# Patient Record
Sex: Female | Born: 1940 | Race: White | Hispanic: No | Marital: Married | Smoking: Never smoker
Health system: Southern US, Community
[De-identification: ages and names within clinical notes are randomized; demographics above are authoritative.]

## PROBLEM LIST (undated history)

## (undated) DIAGNOSIS — M810 Age-related osteoporosis without current pathological fracture: Secondary | ICD-10-CM

## (undated) DIAGNOSIS — E559 Vitamin D deficiency, unspecified: Secondary | ICD-10-CM

## (undated) DIAGNOSIS — Z923 Personal history of irradiation: Secondary | ICD-10-CM

## (undated) DIAGNOSIS — G4700 Insomnia, unspecified: Secondary | ICD-10-CM

## (undated) DIAGNOSIS — C801 Malignant (primary) neoplasm, unspecified: Secondary | ICD-10-CM

## (undated) DIAGNOSIS — E785 Hyperlipidemia, unspecified: Secondary | ICD-10-CM

## (undated) DIAGNOSIS — IMO0001 Reserved for inherently not codable concepts without codable children: Secondary | ICD-10-CM

## (undated) DIAGNOSIS — E079 Disorder of thyroid, unspecified: Secondary | ICD-10-CM

## (undated) DIAGNOSIS — I10 Essential (primary) hypertension: Secondary | ICD-10-CM

## (undated) DIAGNOSIS — K219 Gastro-esophageal reflux disease without esophagitis: Secondary | ICD-10-CM

## (undated) HISTORY — PX: HIP ARTHROPLASTY: SHX981

## (undated) HISTORY — DX: Disorder of thyroid, unspecified: E07.9

## (undated) HISTORY — DX: Malignant (primary) neoplasm, unspecified: C80.1

## (undated) HISTORY — DX: Hyperlipidemia, unspecified: E78.5

## (undated) HISTORY — PX: THYROIDECTOMY: SHX17

## (undated) HISTORY — PX: BREAST SURGERY: SHX581

## (undated) HISTORY — DX: Gastro-esophageal reflux disease without esophagitis: K21.9

## (undated) HISTORY — DX: Insomnia, unspecified: G47.00

## (undated) HISTORY — DX: Reserved for inherently not codable concepts without codable children: IMO0001

## (undated) HISTORY — PX: OTHER SURGICAL HISTORY: SHX169

## (undated) HISTORY — DX: Age-related osteoporosis without current pathological fracture: M81.0

## (undated) HISTORY — DX: Vitamin D deficiency, unspecified: E55.9

## (undated) HISTORY — PX: BUNIONECTOMY: SHX129

## (undated) HISTORY — DX: Essential (primary) hypertension: I10

## (undated) HISTORY — PX: ABDOMINAL HYSTERECTOMY: SHX81

## (undated) HISTORY — PX: COLONOSCOPY: SHX174

## (undated) HISTORY — PX: TONSILLECTOMY: SUR1361

---

## 2005-10-03 ENCOUNTER — Ambulatory Visit: Payer: Self-pay | Admitting: Family Medicine

## 2005-10-21 ENCOUNTER — Ambulatory Visit: Payer: Self-pay | Admitting: Family Medicine

## 2006-07-23 ENCOUNTER — Emergency Department: Payer: Self-pay | Admitting: Emergency Medicine

## 2006-07-23 ENCOUNTER — Other Ambulatory Visit: Payer: Self-pay

## 2006-08-09 ENCOUNTER — Ambulatory Visit: Payer: Self-pay | Admitting: Family Medicine

## 2006-12-18 ENCOUNTER — Ambulatory Visit: Payer: Self-pay | Admitting: Unknown Physician Specialty

## 2007-08-13 ENCOUNTER — Ambulatory Visit: Payer: Self-pay | Admitting: Family Medicine

## 2008-07-02 ENCOUNTER — Ambulatory Visit: Payer: Self-pay | Admitting: Unknown Physician Specialty

## 2008-09-08 ENCOUNTER — Ambulatory Visit: Payer: Self-pay | Admitting: Family Medicine

## 2008-10-16 ENCOUNTER — Emergency Department: Payer: Self-pay | Admitting: Emergency Medicine

## 2009-09-10 ENCOUNTER — Ambulatory Visit: Payer: Self-pay | Admitting: Family Medicine

## 2010-09-13 ENCOUNTER — Ambulatory Visit: Payer: Self-pay | Admitting: Family Medicine

## 2011-09-13 ENCOUNTER — Ambulatory Visit: Payer: Self-pay | Admitting: Family Medicine

## 2011-09-15 ENCOUNTER — Ambulatory Visit: Payer: Self-pay | Admitting: Family Medicine

## 2012-09-18 ENCOUNTER — Ambulatory Visit: Payer: Self-pay | Admitting: Family Medicine

## 2012-10-16 DIAGNOSIS — H33319 Horseshoe tear of retina without detachment, unspecified eye: Secondary | ICD-10-CM | POA: Insufficient documentation

## 2012-10-24 DIAGNOSIS — E559 Vitamin D deficiency, unspecified: Secondary | ICD-10-CM | POA: Insufficient documentation

## 2013-10-09 ENCOUNTER — Ambulatory Visit: Payer: Self-pay | Admitting: Family Medicine

## 2013-12-26 ENCOUNTER — Ambulatory Visit: Payer: Self-pay | Admitting: Unknown Physician Specialty

## 2013-12-31 LAB — PATHOLOGY REPORT

## 2014-04-24 ENCOUNTER — Ambulatory Visit: Payer: Self-pay | Admitting: Family Medicine

## 2014-05-01 DIAGNOSIS — K5900 Constipation, unspecified: Secondary | ICD-10-CM | POA: Insufficient documentation

## 2014-05-01 DIAGNOSIS — R197 Diarrhea, unspecified: Secondary | ICD-10-CM | POA: Insufficient documentation

## 2014-05-01 DIAGNOSIS — T185XXA Foreign body in anus and rectum, initial encounter: Secondary | ICD-10-CM | POA: Insufficient documentation

## 2014-05-01 DIAGNOSIS — R109 Unspecified abdominal pain: Secondary | ICD-10-CM | POA: Insufficient documentation

## 2014-12-30 ENCOUNTER — Ambulatory Visit: Payer: Self-pay | Admitting: Family Medicine

## 2015-06-24 ENCOUNTER — Encounter: Payer: Self-pay | Admitting: Psychiatry

## 2015-06-24 ENCOUNTER — Ambulatory Visit (INDEPENDENT_AMBULATORY_CARE_PROVIDER_SITE_OTHER): Payer: Medicare PPO | Admitting: Psychiatry

## 2015-06-24 VITALS — BP 142/88 | HR 78 | Temp 97.3°F | Ht 62.5 in | Wt 147.4 lb

## 2015-06-24 DIAGNOSIS — IMO0001 Reserved for inherently not codable concepts without codable children: Secondary | ICD-10-CM | POA: Insufficient documentation

## 2015-06-24 DIAGNOSIS — F4322 Adjustment disorder with anxiety: Secondary | ICD-10-CM

## 2015-06-24 DIAGNOSIS — C73 Malignant neoplasm of thyroid gland: Secondary | ICD-10-CM | POA: Insufficient documentation

## 2015-06-24 DIAGNOSIS — E789 Disorder of lipoprotein metabolism, unspecified: Secondary | ICD-10-CM | POA: Insufficient documentation

## 2015-06-24 DIAGNOSIS — G4700 Insomnia, unspecified: Secondary | ICD-10-CM | POA: Insufficient documentation

## 2015-06-24 DIAGNOSIS — I10 Essential (primary) hypertension: Secondary | ICD-10-CM | POA: Insufficient documentation

## 2015-06-24 DIAGNOSIS — M810 Age-related osteoporosis without current pathological fracture: Secondary | ICD-10-CM | POA: Insufficient documentation

## 2015-06-24 DIAGNOSIS — M1990 Unspecified osteoarthritis, unspecified site: Secondary | ICD-10-CM | POA: Insufficient documentation

## 2015-06-24 DIAGNOSIS — K219 Gastro-esophageal reflux disease without esophagitis: Secondary | ICD-10-CM

## 2015-06-24 NOTE — Progress Notes (Signed)
Psychiatric Initial Adult Assessment   Patient Identification: Alisha Stevens MRN:  [id number] Date of Evaluation:  06/24/2015 Referral Source: Dr. Norman Clay Chief Complaint:  "I hope a third party will be able to's shed some light" on dealing with our son. Chief Complaint    Establish Care; Stress     Visit Diagnosis: No diagnosis found. Diagnosis:   Patient Active Problem List   Diagnosis Date Noted  . Borderline high serum cholesterol [E78.9] 06/24/2015  . BP (high blood pressure) [I10] 06/24/2015  . Cannot sleep [G47.00] 06/24/2015  . 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 Reflux [K21.9] 06/24/2015  . OP (osteoporosis) [M81.0] 06/24/2015  . Arthritis, degenerative [M19.90] 06/24/2015  . Abdominal pain [R10.9] 05/01/2014  . CN (constipation) [K59.00] 05/01/2014  . D (diarrhea) [R19.7] 05/01/2014  . Foreign body of rectum [T18.5XXA] 05/01/2014  . Avitaminosis D [E55.9] 10/24/2012  . 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 She states she has anxiety and worry about her adult son. She relates that her son has an issue with alcohol. She related that 7-8 years ago he was arrested for a DUI. She related that at the time his children were in the car. She states eventually lost his family and has experienced multiple job losses. However they state he intermittently continues to abuse alcohol.  Patient denied symptoms of a major depressive episode but states that she does have periods where she becomes socially withdrawn and might not attend her exercise classes or be around other people. She states that her anxiety is more constant and its worry some and daily. She states that all centers around her son. She states that there are periods where he can do well when he is not drinking and they can have good social interactions with him. However she stated that he then can have  exacerbation of his drinking, become verbally abusive to his second wife. She relates he is also having problems with child support and garnishing of his wages. She relates that in the past they have given him money and been supportive. She relates they worry about their grandsons. Patient states she continues to worry and be frustrated by his alcohol use, refusal to engage in any kind of treatment or therapy for his issues.  Patient and husband did not appear to be looking for medications at this point but rather interested in coping mechanisms and therapy related to the anxiety worry and frustration. Elements:  Duration:  As noted above.. Associated Signs/Symptoms: Depression Symptoms:  As noted above patient denied any symptoms of major depressive episode but states that she can have periods where she socially withdrawn and does not attend things like exercise classes. (Hypo) Manic Symptoms:  None Anxiety Symptoms:  Worry about son and grandchildren as discussed above Psychotic Symptoms:  None PTSD Symptoms: Negative  Past Medical History:  Past Medical History  Diagnosis Date  . Insomnia   . Osteoporosis   . Hyperlipemia   . Hypertension   . Reflux   . Vitamin D deficiency   . Cancer   . Thyroid disease     Past Surgical History  Procedure Laterality Date  . Thyroidectomy    . Bunionectomy Right   . Breast surgery    . Abdominal hysterectomy    . Tonsillectomy    . Colonoscopy    . Retinopexy Right    Family History:  Family History  Problem Relation Age  of Onset  . Congestive Heart Failure Mother   . Hypertension Mother   . Stroke Mother   . Colon polyps Father   . Heart attack Father   . Multiple sclerosis Sister   . Alcohol abuse Paternal Grandmother   . Hypertension Sister    Social History:   History   Social History  . Marital Status: Married    Spouse Name: N/A  . Number of Children: N/A  . Years of Education: N/A   Social History Main Topics  .  Smoking status: Never Smoker   . Smokeless tobacco: Never Used  . Alcohol Use: 1.8 oz/week    0 Standard drinks or equivalent, 3 Glasses of wine per week  . Drug Use: No  . Sexual Activity: Yes    Birth Control/ Protection: None   Other Topics Concern  . None   Social History Narrative  . None   Additional Social History: Patient states she grew up in a small town in [place]. She states that she met her husband in [place]. She states they've spent time in [place] and then now have been in [place] for 40 years. 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 They have one daughter age 34 and 34 son age 59.  Musculoskeletal: Strength & Muscle Tone: within normal limits Gait & Station: normal Patient leans: N/A  Psychiatric Specialty Exam: HPI  Review of Systems  Psychiatric/Behavioral: Negative for depression, suicidal ideas, hallucinations, memory loss and substance abuse. The patient is nervous/anxious (Related to son and the consequences of son's alcohol use). The patient does not have insomnia.     Blood pressure 142/88, pulse 78, temperature 97.3 F (36.3 C), temperature source Tympanic, height 5' 2.5" (1.588 m), weight 147 lb 6.4 oz (66.86 kg), SpO2 95 %.Body mass index is 26.51 kg/(m^2).  General Appearance: Neat and Well Groomed  Eye Contact:  Good  Speech:  Normal Rate  Volume:  Normal  Mood:  Good  Affect:  Congruent  Thought Process:  Linear and Logical  Orientation:  Full (Time, Place, and Person)  Thought Content:  Negative  Suicidal Thoughts:  No  Homicidal Thoughts:  No  Memory:  Immediate;   Good Recent;   Good Remote;   Good  Judgement:  Good  Insight:  Good  Psychomotor Activity:  Negative  Concentration:  Good  Recall:  Good  Fund of Knowledge:Good  Language: Good  Akathisia:  Negative  Handed:  Right unknown   AIMS (if indicated):  N/A  Assets:  Communication Skills Desire for Improvement Social Support   ADL's:  Intact  Cognition: WNL  Sleep:  okay   Is the patient at risk to self?  No. Has the patient been a risk to self in the past 6 months?  No. Has the patient been a risk to self within the distant past?  No. Is the patient a risk to others?  No. Has the patient been a risk to others in the past 6 months?  No. Has the patient been a risk to others within the distant past?  No.  Allergies:   Allergies  Allergen Reactions  . Hydralazine Other (See Comments)    Confusion, chest pain   . Neomycin Diarrhea   Current Medications: Current Outpatient Prescriptions  Medication Sig Dispense Refill  . amLODipine (NORVASC) 2.5 MG tablet     . aspirin 81 MG tablet Take by mouth.    . Cholecalciferol (VITAMIN  D-1000 MAX ST) 1000 UNITS tablet Take by mouth.    . levothyroxine (SYNTHROID, LEVOTHROID) 125 MCG tablet     . omeprazole (PRILOSEC) 40 MG capsule      No current facility-administered medications for this visit.    Previous Psychotropic Medications: No   Substance Abuse History in the last 12 months:  No.  Consequences of Substance Abuse: NA  Medical Decision Making:  Review of Psycho-Social Stressors (1) and Established Problem, Worsening (2)  Treatment Plan Summary: Plan Patient is presenting for therapy and coping mechanisms related to a alcoholic son. She does not desire medication treatment at this time. Have discussed the case with therapist in the clinic and referred patient for therapy.  In regards to risk assessment the patient has risk factors of age and race. Patient has protective factors of no affective illness, no past suicide attempts, good social support and no current substance use disorder. At this time low risk of imminent harm to herself or others.  Patient will be referred to therapist. Should medication management be desired she has been informed that she can come back to this writer or be referred by therapist.    Faith Rogue 8/3/201611:50  AM

## 2015-07-03 ENCOUNTER — Ambulatory Visit (INDEPENDENT_AMBULATORY_CARE_PROVIDER_SITE_OTHER): Payer: Medicare PPO | Admitting: Licensed Clinical Social Worker

## 2015-07-03 DIAGNOSIS — F4323 Adjustment disorder with mixed anxiety and depressed mood: Secondary | ICD-10-CM

## 2015-07-03 NOTE — Progress Notes (Signed)
Patient ID: Alisha Stevens, female   DOB: 10-17-1941, 74 y.o.   MRN: [id number] Patient:   Alisha Stevens   DOB:   [birth year]  MR Number:  [id number]  Location:  [facility] [address] [place] [address] Dept: [phone]           Date of Service:   07/03/2015  Start Time:   1p End Time:   2p  Provider/Observer:  Lubertha South Counselor       Billing Code/Service: [phone]  Behavioral Observation: Alisha Stevens  presents as a 74 y.o.-year-old Caucasian Female who appeared her stated age. her Stevens was Appropriate and she was Well Groomed and her manners were Appropriate to the situation.  There were not any physical disabilities noted.  she displayed an appropriate level of cooperation and motivation.    Interactions:    Active   Attention:   within normal limits  Memory:   within normal limits  Speech (Volume):  normal  Speech:   normal volume  Thought Process:  Coherent  Though Content:  WNL  Orientation:   person, place, time/date and situation  Judgment:   Fair  Planning:   Fair  Affect:    Depressed  Mood:    Depressed  Insight:   Good  Intelligence:   normal  Chief Complaint:     Chief Complaint  Patient presents with  . Family Problem  . Stress  . Establish Care    Reason for Service:  To learn coping skills to deal with her son  Current Symptoms:  Stress,   Source of Distress:              5 year old son  Marital Status/Living: Married/currently living with her husband  Employment History: retired  Education:   Careers adviser History:  Denies   Careers adviser:  Denies    Religious/Spiritual Preferences:  Attends weekly  Family/Childhood History:                           Reports that she has a good childhood.  Reports that she raised her children right.    Children/Grand-children:    2 children; three Grandchildren  Natural/Informal Support:                           Husband, daughter, friends   Substance Use:  No concerns of substance abuse are reported.   Medical History:   Past Medical History  Diagnosis Date  . Insomnia   . Osteoporosis   . Hyperlipemia   . Hypertension   . Reflux   . Vitamin D deficiency   . Cancer   . Thyroid disease           Medication List       This list is accurate as of: 07/03/15  1:15 PM.  Always use your most recent med list.               amLODipine 2.5 MG tablet  Commonly known as:  NORVASC     aspirin 81 MG tablet  Take by mouth.     levothyroxine 125 MCG tablet  Commonly known as:  SYNTHROID, LEVOTHROID     omeprazole 40 MG capsule  Commonly known as:  PRILOSEC     VITAMIN D-1000 MAX ST 1000 UNITS tablet  Generic drug:  Cholecalciferol  Take by mouth.              Sexual History:   History  Sexual Activity  . Sexual Activity: Yes  . Birth Web designer: None     Abuse/Trauma History: Denies    Psychiatric History:  Denies   Strengths:   Building services engineer, caring   Recovery Goals:  "To get help for my son."  Hobbies/Interests:               Shopping, traveling   Challenges/Barriers: "My son not wanting help."    Family Med/Psych History:  Family History  Problem Relation Age of Onset  . Congestive Heart Failure Mother   . Hypertension Mother   . Stroke Mother   . Colon polyps Father   . Heart attack Father   . Multiple sclerosis Sister   . Alcohol abuse Paternal Grandmother   . 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  She is worried that her 40 year old son will continue to use alcohol to cope with life stressors.   She continues to get stressed thinking about the consequences that have taken place in his life and he continues to use alcohol.  She recently began worrying about her daughter whom is getting separated from her husband.  Patient is diagnosed with Adjustment Disorder due to the recent changes in her life and her inability to cope.  She endorses lack of sleep, anxiety, lack of motivation.  Recommendation/Plan: Writer recommends Outpatient Therapy at least twice monthly to include but not limited to individual, group and or family therapy.

## 2015-07-06 ENCOUNTER — Ambulatory Visit: Payer: Medicare PPO | Admitting: Licensed Clinical Social Worker

## 2015-07-06 ENCOUNTER — Ambulatory Visit: Payer: Self-pay | Admitting: Licensed Clinical Social Worker

## 2016-07-04 ENCOUNTER — Ambulatory Visit (INDEPENDENT_AMBULATORY_CARE_PROVIDER_SITE_OTHER): Payer: Medicare PPO | Admitting: Licensed Clinical Social Worker

## 2016-07-04 DIAGNOSIS — F4323 Adjustment disorder with mixed anxiety and depressed mood: Secondary | ICD-10-CM | POA: Diagnosis not present

## 2016-07-04 NOTE — Progress Notes (Signed)
Comprehensive Clinical Assessment (CCA) Note  07/04/2016 Alisha Stevens [id number]  Visit Diagnosis:   No diagnosis found.    CCA Part One  Part One has been completed on paper by the patient.  (See scanned document in Chart Review)  CCA Part Two A  Intake/Chief Complaint:  CCA Intake With Chief Complaint CCA Part Two Date: 07/04/16 CCA Part Two Time: 106 Chief Complaint/Presenting Problem: my family is falling apart Patients Currently Reported Symptoms/Problems: my son wife kicked him out.  He is currently at my house.  I want him to go to a homeless shelter.  This is so hard.  We let him come back to the house. I am struggling with determining if my husband and I are doing the right thing.  My son has difficulty with alcohol and adultery with their neighbor.Marland Kitchen He continues to dig himself deeeper in the rabbit hole. He refuses to seek help.  I am having emotional breakdown (crying) Individual's Strengths: "I can express myself" Individual's Preferences: My son to get it together Individual's Abilities: to handle anything Type of Services Patient Feels Are Needed: therapy  Mental Health Symptoms Depression:  Depression: Tearfulness, Sleep (too much or little), Change in energy/activity, Difficulty Concentrating  Mania:  Mania: N/A  Anxiety:   Anxiety: Worrying, Tension, Difficulty concentrating  Psychosis:  Psychosis: N/A  Trauma:  Trauma: N/A  Obsessions:  Obsessions: N/A  Compulsions:  Compulsions: N/A  Inattention:  Inattention: N/A  Hyperactivity/Impulsivity:  Hyperactivity/Impulsivity: N/A  Oppositional/Defiant Behaviors:  Oppositional/Defiant Behaviors: N/A  Borderline Personality:  Emotional Irregularity: N/A  Other Mood/Personality Symptoms:      Mental Status Exam Appearance and self-care  Stature:  Stature: Average  Weight:  Weight: Average weight  Clothing:  Clothing: Neat/clean  Grooming:  Grooming: Normal  Cosmetic use:  Cosmetic Use: Age  appropriate  Posture/gait:  Posture/Gait: Normal  Motor activity:  Motor Activity: Not Remarkable  Sensorium  Attention:  Attention: Normal  Concentration:  Concentration: Normal  Orientation:  Orientation: X5  Recall/memory:  Recall/Memory: Normal  Affect and Mood  Affect:  Affect: Appropriate  Mood:     Relating  Eye contact:  Eye Contact: Normal  Facial expression:  Facial Expression: Responsive  Attitude toward examiner:  Attitude Toward Examiner: Cooperative  Thought and Language  Speech flow: Speech Flow: Normal  Thought content:  Thought Content: Appropriate to mood and circumstances  Preoccupation:     Hallucinations:     Organization:     Transport planner of Knowledge:  Fund of Knowledge: Average  Intelligence:  Intelligence: Average  Abstraction:  Abstraction: Normal  Judgement:  Judgement: Normal  Reality Testing:  Reality Testing: Adequate  Insight:  Insight: Good  Decision Making:  Decision Making: Normal  Social Functioning  Social Maturity:  Social Maturity: Responsible  Social Judgement:  Social Judgement: Normal  Stress  Stressors:  Stressors: Family conflict  Coping Ability:  Coping Ability: English as a second language teacher Deficits:     Supports:      Family and Psychosocial History: Family history Marital status: Married Number of Years Married: 40 What types of issues is patient dealing with in the relationship?: "family issues with the kids." Are you sexually active?: No What is your sexual orientation?: heterosexual Does patient have children?: Yes How many children?: 2 Alisha Stevens 49, Alisha Stevens) How is patient's relationship with their children?: Good relationship.  Alisha Stevens recently moved into the home.       Alisha Stevens is gong through a divorce.  Childhood History:  Childhood History By whom was/is the patient raised?: Both parents Additional childhood history information: Born in [place], grew up in [place]  Description of patient's  relationship with caregiver when they were a child: Mother: great, happy Father: great happy childhood Patient's description of current relationship with people who raised him/her: Deceased How were you disciplined when you got in trouble as a child/adolescent?: Father: he didn't do that much spanking.  Mother: she used a "switch" Does patient have siblings?: Yes Number of Siblings: 3 (82, 97, 80) Description of patient's current relationship with siblings: great.  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 Has patient ever been in the [employer]?: No  Education: Education Name of [place]: [employer] Did [employer] Graduate From [employer]?: Yes Did [place]?: Yes Building surveyor) What Type of College Degree Do you Have?: AssociatesBusiness  Religion: Religion/Spirituality Are You A Religious Person?: No  Leisure/Recreation: Leisure / Recreation Leisure and Hobbies: play bridge, entertain, walk, play kanasta, in the [employer], lunch groups  Exercise/Diet: Exercise/Diet Do You Exercise?: Yes What Type of Exercise Do You Do?: Run/Walk How Many Times a Week Do You Exercise?: Daily Have You Gained or Lost A Significant Amount of Weight in the Past Six Months?: No Do You Follow a Special Diet?: No Do You Have Any Trouble Sleeping?: Yes Explanation of Sleeping Difficulties: occassionally. I  have difficulty sleeping  CCA Part Two C  Alcohol/Drug Use: Alcohol / Drug Use Pain Medications: no Prescriptions: Blood Pressure, Synthroid, Prilosec Over the Counter: Vitman D & Aspirin, Calcium History of alcohol / drug use?: No history of alcohol / drug abuse                      CCA Part Three  ASAM's:  Six Dimensions of Multidimensional Assessment  Dimension 1:  Acute Intoxication and/or Withdrawal Potential:     Dimension 2:  Biomedical Conditions and Complications:     Dimension 3:  Emotional, Behavioral, or Cognitive Conditions and Complications:     Dimension 4:  Readiness to Change:     Dimension 5:  Relapse, Continued use, or Continued Problem Potential:     Dimension 6:  Recovery/Living Environment:      Substance use Disorder (SUD)    Social Function:  Social Functioning Social Maturity: Responsible Social Judgement: Normal  Stress:  Stress Stressors: Family conflict Coping Ability: Overwhelmed Patient Takes Medications The Way The Doctor Instructed?: Yes Priority Risk: Low Acuity  Risk Assessment- Self-Harm Potential: Risk Assessment For Self-Harm Potential Thoughts of Self-Harm: No current thoughts Method: No plan Availability of Means: No access/NA Additional Information for Self-Harm Potential: Acts of Self-harm  Risk Assessment -Dangerous to Others Potential: Risk Assessment For Dangerous to Others Potential Method: No Plan Availability of Means: No access or NA Intent: Vague intent or NA Notification Required: No need or identified person  DSM5 Diagnoses: Patient Active Problem List   Diagnosis Date  Noted  . Borderline high serum cholesterol 06/24/2015  . BP (high blood pressure) 06/24/2015  . Cannot sleep 06/24/2015  . Carcinoma of thyroid ([place]) 06/24/2015  . Reflux 06/24/2015  . OP (osteoporosis) 06/24/2015  . Arthritis, degenerative 06/24/2015  . Abdominal pain 05/01/2014  . CN (constipation) 05/01/2014  . D (diarrhea)  05/01/2014  . Foreign body of rectum 05/01/2014  . Avitaminosis D 10/24/2012  . 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

## 2016-07-11 ENCOUNTER — Other Ambulatory Visit: Payer: Self-pay | Admitting: Family Medicine

## 2016-07-11 DIAGNOSIS — Z1231 Encounter for screening mammogram for malignant neoplasm of breast: Secondary | ICD-10-CM

## 2016-07-19 ENCOUNTER — Ambulatory Visit
Admission: RE | Admit: 2016-07-19 | Discharge: 2016-07-19 | Disposition: A | Discharge status: Home / Self Care | Payer: Medicare PPO | Source: Ambulatory Visit | Attending: Family Medicine | Admitting: Family Medicine

## 2016-07-19 DIAGNOSIS — Z1231 Encounter for screening mammogram for malignant neoplasm of breast: Secondary | ICD-10-CM | POA: Insufficient documentation

## 2017-06-13 ENCOUNTER — Other Ambulatory Visit: Payer: Self-pay | Admitting: Family Medicine

## 2017-06-13 DIAGNOSIS — Z1239 Encounter for other screening for malignant neoplasm of breast: Secondary | ICD-10-CM

## 2017-06-23 ENCOUNTER — Other Ambulatory Visit: Payer: Self-pay | Admitting: Family Medicine

## 2017-06-23 DIAGNOSIS — Z1231 Encounter for screening mammogram for malignant neoplasm of breast: Secondary | ICD-10-CM

## 2017-07-31 ENCOUNTER — Ambulatory Visit
Admission: RE | Admit: 2017-07-31 | Discharge: 2017-07-31 | Disposition: A | Discharge status: Home / Self Care | Payer: Medicare HMO | Source: Ambulatory Visit | Attending: Family Medicine | Admitting: Family Medicine

## 2017-07-31 DIAGNOSIS — Z1239 Encounter for other screening for malignant neoplasm of breast: Secondary | ICD-10-CM

## 2017-07-31 DIAGNOSIS — Z1231 Encounter for screening mammogram for malignant neoplasm of breast: Secondary | ICD-10-CM | POA: Insufficient documentation

## 2018-10-08 IMAGING — MG MM DIGITAL SCREENING BILAT W/ CAD
4 series · 4 of 4 positions shown · non-contrast
Comparison: Previous exam(s).

CLINICAL DATA: Screening.

EXAM:
DIGITAL SCREENING BILATERAL MAMMOGRAM WITH CAD

[L CC]
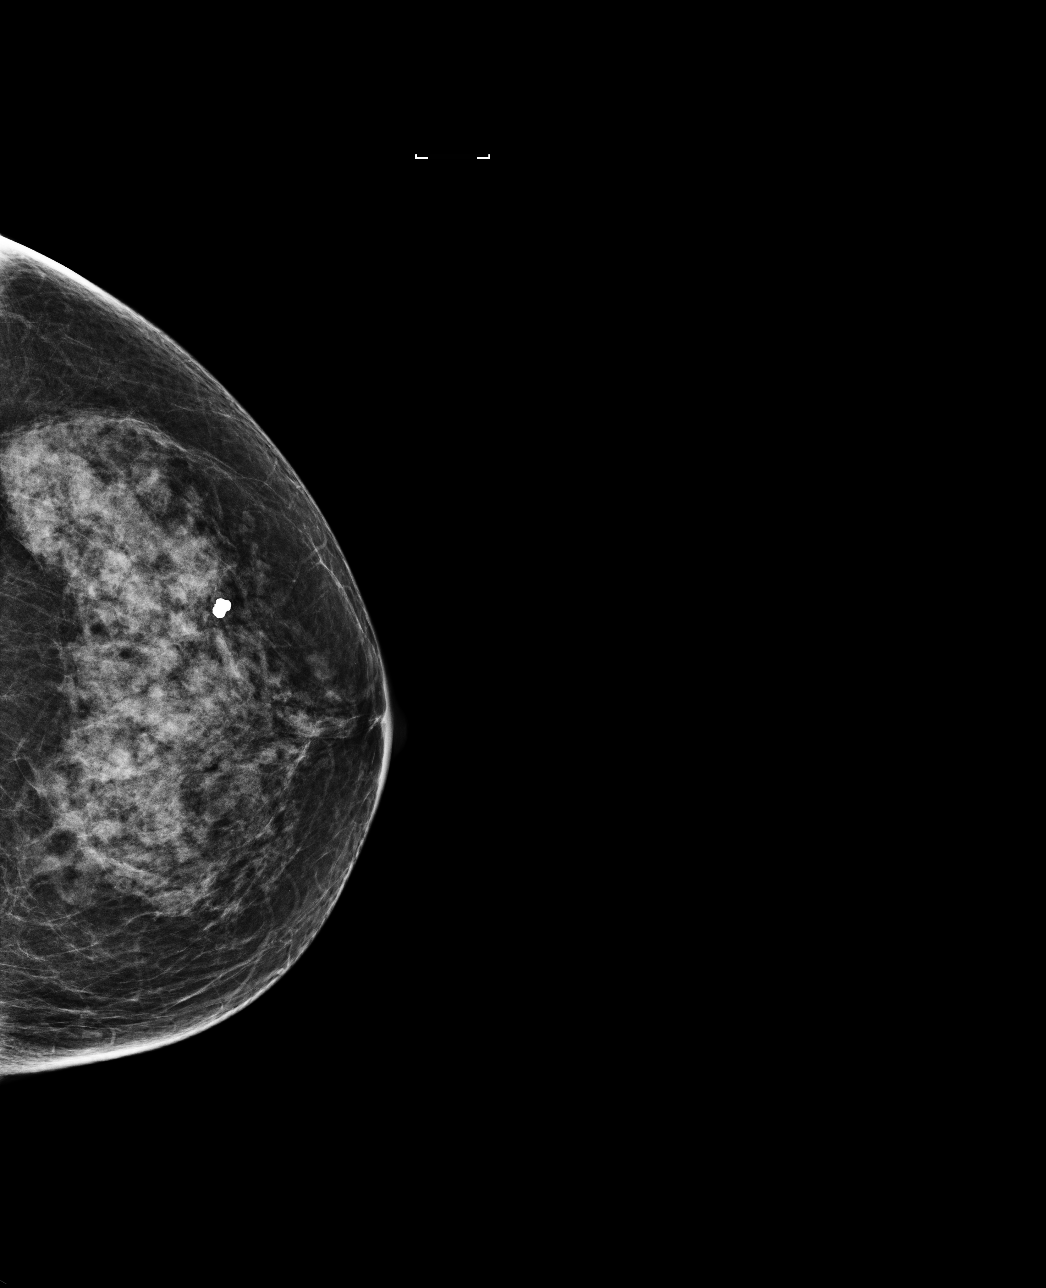

[R MLO]
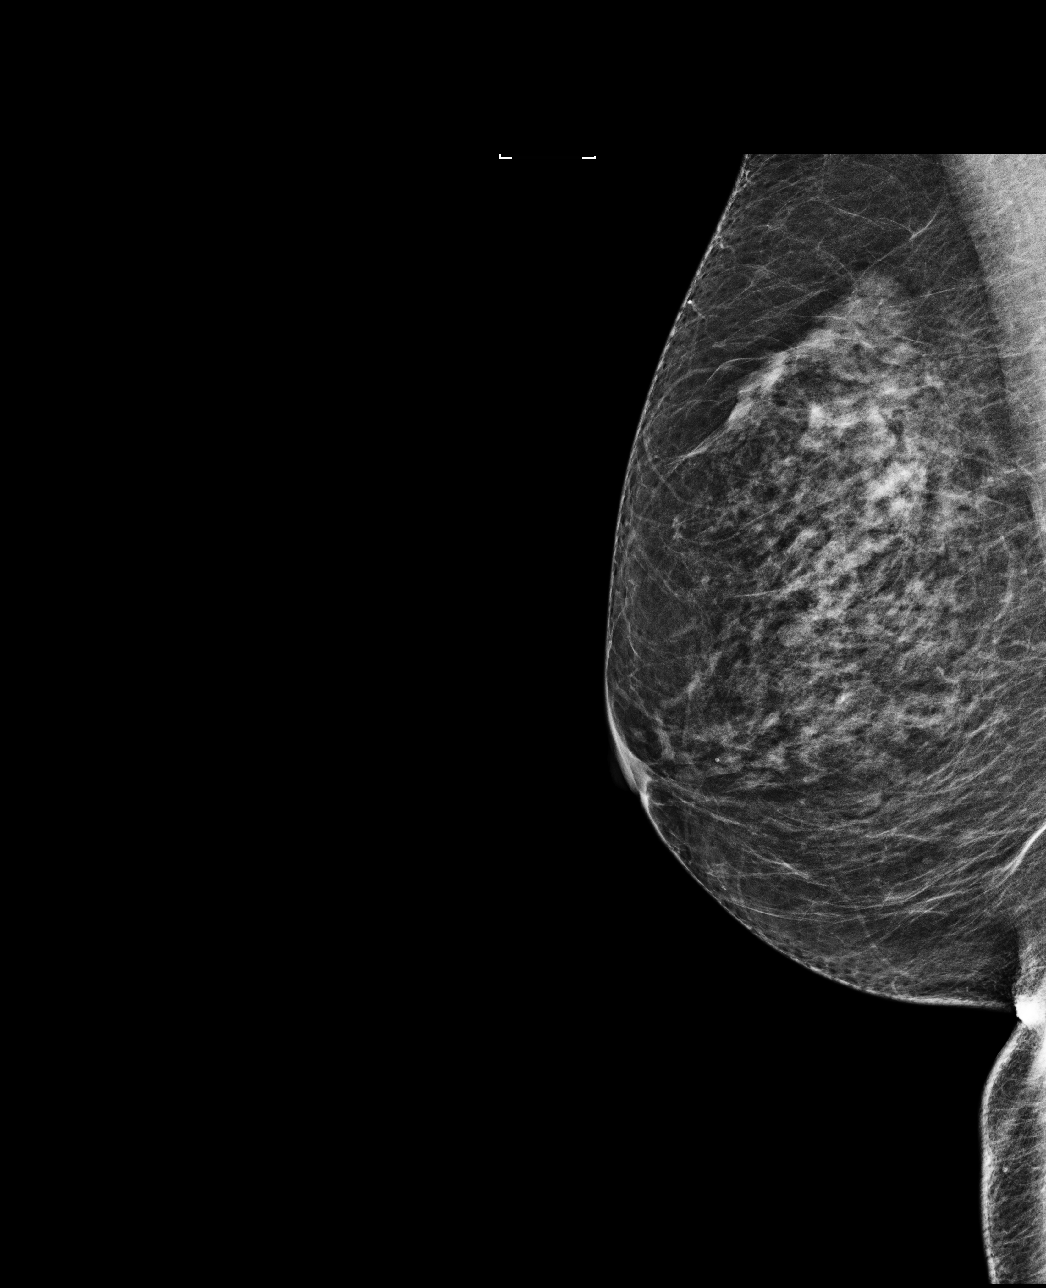

[L MLO]
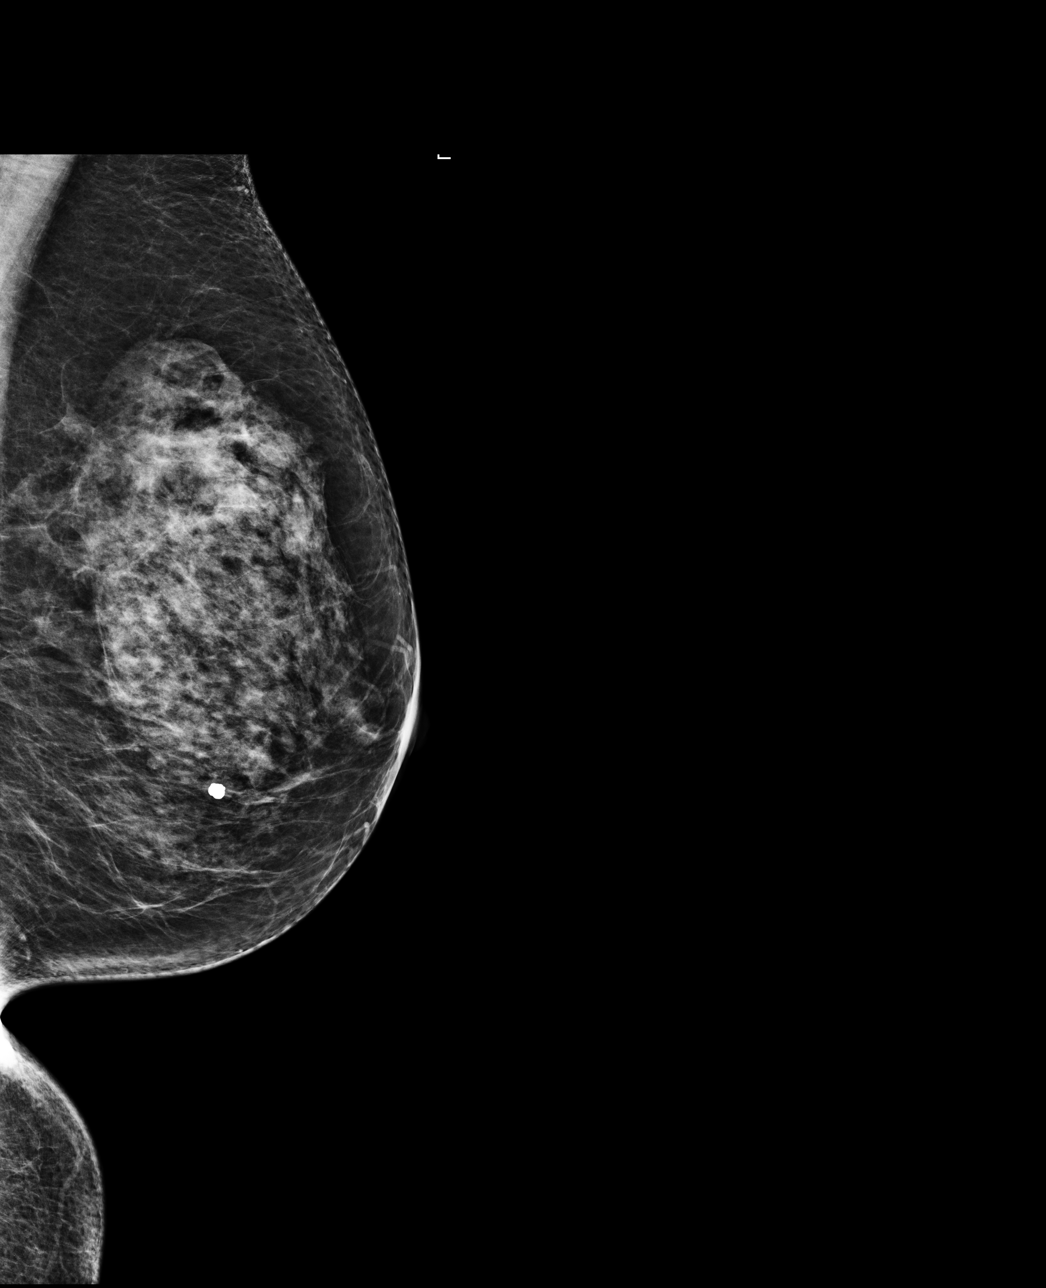

[R CC]
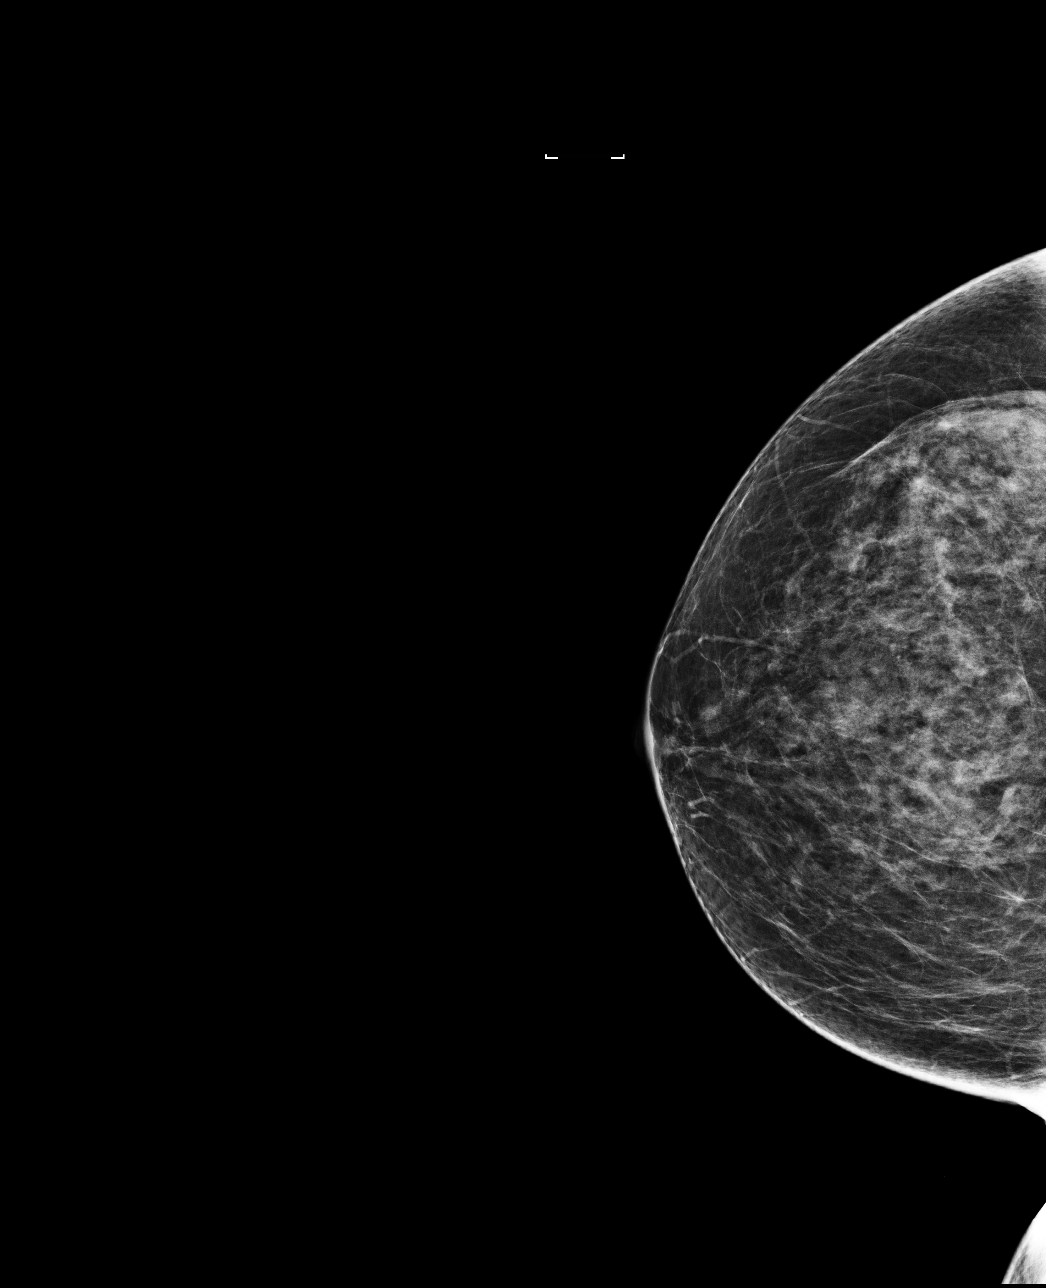

[4 of 4 positions shown; findings below may reference images not displayed]

ACR Breast Density Category c: The breast tissue is heterogeneously
dense, which may obscure small masses.
FINDINGS: There are no findings suspicious for malignancy. Images were
processed with CAD.
IMPRESSION: No mammographic evidence of malignancy. A result letter of this
screening mammogram will be mailed directly to the patient.

RECOMMENDATION:
Screening mammogram in one year. (Code:[id number])

BI-RADS CATEGORY  1: Negative.

## 2018-12-07 ENCOUNTER — Other Ambulatory Visit: Payer: Self-pay | Admitting: Family Medicine

## 2018-12-07 DIAGNOSIS — Z1231 Encounter for screening mammogram for malignant neoplasm of breast: Secondary | ICD-10-CM

## 2018-12-09 DIAGNOSIS — E890 Postprocedural hypothyroidism: Secondary | ICD-10-CM | POA: Insufficient documentation

## 2018-12-20 ENCOUNTER — Encounter (INDEPENDENT_AMBULATORY_CARE_PROVIDER_SITE_OTHER): Payer: Self-pay

## 2018-12-20 ENCOUNTER — Ambulatory Visit
Admission: RE | Admit: 2018-12-20 | Discharge: 2018-12-20 | Disposition: A | Discharge status: Home / Self Care | Payer: Medicare HMO | Source: Ambulatory Visit | Attending: Family Medicine | Admitting: Family Medicine

## 2018-12-20 DIAGNOSIS — Z1231 Encounter for screening mammogram for malignant neoplasm of breast: Secondary | ICD-10-CM | POA: Diagnosis present

## 2018-12-20 HISTORY — DX: Personal history of irradiation: Z92.3

## 2019-03-11 ENCOUNTER — Encounter: Admission: RE | Payer: Self-pay | Source: Home / Self Care

## 2019-03-11 ENCOUNTER — Ambulatory Visit
Admission: RE | Admit: 2019-03-11 | Payer: Medicare HMO | Source: Home / Self Care | Admitting: Unknown Physician Specialty

## 2019-03-11 SURGERY — COLONOSCOPY WITH PROPOFOL
Anesthesia: General

## 2020-01-27 ENCOUNTER — Other Ambulatory Visit: Payer: Self-pay | Admitting: Certified Registered Nurse Anesthetist

## 2020-01-27 ENCOUNTER — Other Ambulatory Visit: Payer: Self-pay | Admitting: Family Medicine

## 2020-01-27 DIAGNOSIS — E2839 Other primary ovarian failure: Secondary | ICD-10-CM

## 2020-01-27 DIAGNOSIS — Z1231 Encounter for screening mammogram for malignant neoplasm of breast: Secondary | ICD-10-CM

## 2020-06-16 ENCOUNTER — Other Ambulatory Visit: Admission: RE | Admit: 2020-06-16 | Payer: Medicare HMO | Source: Ambulatory Visit

## 2020-06-17 ENCOUNTER — Other Ambulatory Visit
Admission: RE | Admit: 2020-06-17 | Discharge: 2020-06-17 | Disposition: A | Discharge status: Home / Self Care | Payer: Medicare HMO | Source: Ambulatory Visit | Attending: Internal Medicine | Admitting: Internal Medicine

## 2020-06-17 ENCOUNTER — Other Ambulatory Visit: Payer: Self-pay

## 2020-06-17 DIAGNOSIS — Z20822 Contact with and (suspected) exposure to covid-19: Secondary | ICD-10-CM | POA: Diagnosis not present

## 2020-06-17 DIAGNOSIS — Z01812 Encounter for preprocedural laboratory examination: Secondary | ICD-10-CM | POA: Insufficient documentation

## 2020-06-17 LAB — SARS CORONAVIRUS 2 (TAT 6-24 HRS): SARS Coronavirus 2: NEGATIVE

## 2020-06-18 ENCOUNTER — Encounter
Admission: RE | Disposition: A | Discharge status: Home / Self Care | Payer: Self-pay | Source: Home / Self Care | Attending: Internal Medicine

## 2020-06-18 ENCOUNTER — Ambulatory Visit: Payer: Medicare HMO | Admitting: Certified Registered Nurse Anesthetist

## 2020-06-18 ENCOUNTER — Ambulatory Visit
Admission: RE | Admit: 2020-06-18 | Discharge: 2020-06-18 | Disposition: A | Discharge status: Home / Self Care | Payer: Medicare HMO | Attending: Internal Medicine | Admitting: Internal Medicine

## 2020-06-18 ENCOUNTER — Encounter: Payer: Self-pay | Admitting: Internal Medicine

## 2020-06-18 ENCOUNTER — Other Ambulatory Visit: Payer: Self-pay

## 2020-06-18 DIAGNOSIS — K56699 Other intestinal obstruction unspecified as to partial versus complete obstruction: Secondary | ICD-10-CM | POA: Diagnosis not present

## 2020-06-18 DIAGNOSIS — K641 Second degree hemorrhoids: Secondary | ICD-10-CM | POA: Diagnosis not present

## 2020-06-18 DIAGNOSIS — E559 Vitamin D deficiency, unspecified: Secondary | ICD-10-CM | POA: Diagnosis not present

## 2020-06-18 DIAGNOSIS — Z86010 Personal history of colonic polyps: Secondary | ICD-10-CM | POA: Insufficient documentation

## 2020-06-18 DIAGNOSIS — K449 Diaphragmatic hernia without obstruction or gangrene: Secondary | ICD-10-CM | POA: Diagnosis not present

## 2020-06-18 DIAGNOSIS — E890 Postprocedural hypothyroidism: Secondary | ICD-10-CM | POA: Diagnosis not present

## 2020-06-18 DIAGNOSIS — Z79899 Other long term (current) drug therapy: Secondary | ICD-10-CM | POA: Insufficient documentation

## 2020-06-18 DIAGNOSIS — Z7982 Long term (current) use of aspirin: Secondary | ICD-10-CM | POA: Insufficient documentation

## 2020-06-18 DIAGNOSIS — Z923 Personal history of irradiation: Secondary | ICD-10-CM | POA: Insufficient documentation

## 2020-06-18 DIAGNOSIS — K5730 Diverticulosis of large intestine without perforation or abscess without bleeding: Secondary | ICD-10-CM | POA: Insufficient documentation

## 2020-06-18 DIAGNOSIS — K317 Polyp of stomach and duodenum: Secondary | ICD-10-CM | POA: Insufficient documentation

## 2020-06-18 DIAGNOSIS — K219 Gastro-esophageal reflux disease without esophagitis: Secondary | ICD-10-CM | POA: Insufficient documentation

## 2020-06-18 DIAGNOSIS — E785 Hyperlipidemia, unspecified: Secondary | ICD-10-CM | POA: Insufficient documentation

## 2020-06-18 DIAGNOSIS — D509 Iron deficiency anemia, unspecified: Secondary | ICD-10-CM | POA: Diagnosis not present

## 2020-06-18 DIAGNOSIS — Z79890 Hormone replacement therapy (postmenopausal): Secondary | ICD-10-CM | POA: Insufficient documentation

## 2020-06-18 DIAGNOSIS — K2970 Gastritis, unspecified, without bleeding: Secondary | ICD-10-CM | POA: Diagnosis not present

## 2020-06-18 DIAGNOSIS — Z791 Long term (current) use of non-steroidal anti-inflammatories (NSAID): Secondary | ICD-10-CM | POA: Insufficient documentation

## 2020-06-18 DIAGNOSIS — G4700 Insomnia, unspecified: Secondary | ICD-10-CM | POA: Insufficient documentation

## 2020-06-18 DIAGNOSIS — Z85850 Personal history of malignant neoplasm of thyroid: Secondary | ICD-10-CM | POA: Insufficient documentation

## 2020-06-18 DIAGNOSIS — I10 Essential (primary) hypertension: Secondary | ICD-10-CM | POA: Insufficient documentation

## 2020-06-18 HISTORY — PX: COLONOSCOPY WITH PROPOFOL: SHX5780

## 2020-06-18 HISTORY — PX: ESOPHAGOGASTRODUODENOSCOPY (EGD) WITH PROPOFOL: SHX5813

## 2020-06-18 SURGERY — COLONOSCOPY WITH PROPOFOL
Anesthesia: General

## 2020-06-18 MED ORDER — GLYCOPYRROLATE 0.2 MG/ML IJ SOLN
INTRAMUSCULAR | Status: AC
  Filled 2020-06-18: qty 1

## 2020-06-18 MED ORDER — LIDOCAINE HCL (CARDIAC) PF 100 MG/5ML IV SOSY
PREFILLED_SYRINGE | INTRAVENOUS | Status: DC | PRN
  Administered 2020-06-18: 100 mg via INTRAVENOUS

## 2020-06-18 MED ORDER — SODIUM CHLORIDE 0.9 % IV SOLN: INTRAVENOUS | Status: DC

## 2020-06-18 MED ORDER — PROPOFOL 10 MG/ML IV BOLUS
INTRAVENOUS | Status: DC | PRN
  Administered 2020-06-18: 70 mg via INTRAVENOUS

## 2020-06-18 MED ORDER — PROPOFOL 500 MG/50ML IV EMUL
INTRAVENOUS | Status: DC | PRN
  Administered 2020-06-18: 160 ug/kg/min via INTRAVENOUS

## 2020-06-18 NOTE — Interval H&P Note (Signed)
History and Physical Interval Note:  06/18/2020 11:18 AM  Alisha Stevens  has presented today for surgery, with the diagnosis of IDA P HX TA POLYPS.  The various methods of treatment have been discussed with the patient and family. After consideration of risks, benefits and other options for treatment, the patient has consented to  Procedure(s): COLONOSCOPY WITH PROPOFOL (N/A) ESOPHAGOGASTRODUODENOSCOPY (EGD) WITH PROPOFOL (N/A) as a surgical intervention.  The patient's history has been reviewed, patient examined, no change in status, stable for surgery.  I have reviewed the patient's chart and labs.  Questions were answered to the patient's satisfaction.     [place], [place]

## 2020-06-18 NOTE — Op Note (Signed)
[facility] Gastroenterology Patient Name: Alisha Stevens Procedure Date: 06/18/2020 11:52 AM MRN: [id number] Account #: [id number] Date of Birth: [birth year] Admit Type: Outpatient Age: 79 Room: [facility] ENDO ROOM 4 Gender: Female Note Status: Finalized Procedure:             Upper GI endoscopy Indications:           Suspected upper gastrointestinal bleeding in patient                         with unexplained iron deficiency anemia Providers:             Benay Pike. Alice Reichert MD, MD Referring MD:          Bo Mcclintock. Vickki Muff (Referring MD) Medicines:             Propofol per Anesthesia Complications:         No immediate complications. Procedure:             Pre-Anesthesia Assessment:                        - The risks and benefits of the procedure and the                         sedation options and risks were discussed with the                         patient. All questions were answered and informed                         consent was obtained.                        - Patient identification and proposed procedure were                         verified prior to the procedure by the nurse. The                         procedure was verified in the procedure room.                        - ASA Grade Assessment: III - A patient with severe                         systemic disease.                        - After reviewing the risks and benefits, the patient                         was deemed in satisfactory condition to undergo the                         procedure.                        After obtaining informed consent, the endoscope was                         passed under direct vision.  Throughout the procedure,                         the patient's blood pressure, pulse, and oxygen                         saturations were monitored continuously. The Endoscope                         was introduced through the mouth, and advanced to the                         third  part of duodenum. The upper GI endoscopy was                         accomplished without difficulty. The patient tolerated                         the procedure well. Findings:      The esophagus was normal.      A 3 cm hiatal hernia was present.      Scattered mild inflammation characterized by erythema was found in the       gastric body and in the gastric antrum. Biopsies were taken with a cold       forceps for Helicobacter pylori testing.      A single 5 mm sessile polyp with no bleeding was found in the second       portion of the duodenum. Biopsies were taken with a cold forceps for       histology.      The duodenal bulb and third portion of the duodenum were normal.      The exam was otherwise without abnormality. Impression:            - Normal esophagus.                        - 3 cm hiatal hernia.                        - Gastritis. Biopsied.                        - A single duodenal polyp. Biopsied.                        - Normal duodenal bulb and third portion of the                         duodenum.                        - The examination was otherwise normal. Recommendation:        - Await pathology results.                        - Proceed with colonoscopy Procedure Code(s):     --- Professional ---                        [phone], Esophagogastroduodenoscopy, flexible,  transoral; with biopsy, single or multiple Diagnosis Code(s):     --- Professional ---                        D50.9, Iron deficiency anemia, unspecified                        K31.7, Polyp of stomach and duodenum                        K29.70, Gastritis, unspecified, without bleeding                        K44.9, Diaphragmatic hernia without obstruction or                         gangrene CPT copyright 2019 American Medical Association. All rights reserved. The codes documented in this report are preliminary and upon coder review may  be revised to meet current compliance  requirements. Efrain Sella MD, MD 06/18/2020 12:09:20 PM This report has been signed electronically. Number of Addenda: 0 Note Initiated On: 06/18/2020 11:52 AM Estimated Blood Loss:  Estimated blood loss: none.      [facility]

## 2020-06-18 NOTE — Op Note (Addendum)
[facility] Gastroenterology Patient Name: Alisha Stevens Procedure Date: 06/18/2020 11:51 AM MRN: [id number] Account #: [id number] Date of Birth: [birth year] Admit Type: Outpatient Age: 79 Room: [facility] ENDO ROOM 4 Gender: Female Note Status: Finalized Procedure:             Colonoscopy Indications:           Surveillance: Personal history of adenomatous polyps                         on last colonoscopy > 5 years ago Providers:             Lorie Apley K. Alice Reichert MD, MD Referring MD:          Bo Mcclintock. Vickki Muff (Referring MD) Medicines:             Propofol per Anesthesia Complications:         No immediate complications. Procedure:             Pre-Anesthesia Assessment:                        - The risks and benefits of the procedure and the                         sedation options and risks were discussed with the                         patient. All questions were answered and informed                         consent was obtained.                        - Patient identification and proposed procedure were                         verified prior to the procedure by the nurse. The                         procedure was verified in the procedure room.                        - ASA Grade Assessment: III - A patient with severe                         systemic disease.                        - After reviewing the risks and benefits, the patient                         was deemed in satisfactory condition to undergo the                         procedure.                        After obtaining informed consent, the colonoscope was                         passed under direct vision.  Throughout the procedure,                         the patient's blood pressure, pulse, and oxygen                         saturations were monitored continuously. The                         Colonoscope was introduced through the anus with the                         intention of advancing to the  cecum. The scope was                         advanced to the sigmoid colon before the procedure was                         aborted. Medications were given. The colonoscopy was                         aborted due to the unusual difficulty of the                         procedure. Withdrawing the scope and replacing with                         the pediatric colonoscope and applying abdominal                         pressure did not allow for the successful completion                         of the procedure. The colonoscopy was technically                         difficult and complex due to multiple diverticula in                         the colon and bowel stenosis. The patient tolerated                         the procedure well. The quality of the bowel                         preparation was good. Findings:      The perianal exam findings include internal hemorrhoids that prolapse       with straining, but spontaneously regress to the resting position (Grade       II).      The digital rectal exam was normal. Pertinent negatives include normal       sphincter tone.      Non-bleeding internal hemorrhoids were found during retroflexion. The       hemorrhoids were Grade II (internal hemorrhoids that prolapse but reduce       spontaneously).      Multiple small and large-mouthed diverticula were found in the mid       sigmoid colon. There was narrowing of the colon in association  with the       diverticular opening. There was no evidence of diverticular bleeding.       Estimated blood loss: none.      Procedure aborted in the mid-sigmoid colon. Estimated blood loss: none. Impression:            - The procedure was aborted due to the unusual                         difficulty of the procedure.                        - Internal hemorrhoids that prolapse with straining,                         but spontaneously regress to the resting position                         (Grade II) found on  perianal exam.                        - Non-bleeding internal hemorrhoids.                        - Severe diverticulosis in the mid sigmoid colon.                         There was narrowing of the colon in association with                         the diverticular opening. There was no evidence of                         diverticular bleeding.                        - No specimens collected. Recommendation:        - Refer to [facility] interventional endoscopist at                         appointment to be scheduled.                        - Attempt to complete colonoscopy with 'baby scope' ;                         will ask expert faculty at [place] to help with completion of this procedure.                        - Await pathology results from EGD, also performed                         today.                        - The findings and recommendations were discussed with  the patient.                        - Return to physician assistant after studies are                         complete. Procedure Code(s):     --- Professional ---                        A7681, 53, Colorectal cancer screening; colonoscopy on                         individual at high risk Diagnosis Code(s):     --- Professional ---                        K57.30, Diverticulosis of large intestine without                         perforation or abscess without bleeding                        K64.1, Second degree hemorrhoids                        Z86.010, Personal history of colonic polyps CPT copyright 2019 American Medical Association. All rights reserved. The codes documented in this report are preliminary and upon coder review may  be revised to meet current compliance requirements. Efrain Sella MD, MD 06/18/2020 12:34:34 PM This report has been signed electronically. Number of Addenda: 0 Note Initiated On: 06/18/2020 11:51 AM Total Procedure  Duration: 0 hours 16 minutes 0 seconds  Estimated Blood Loss:  Estimated blood loss: none.      [facility]

## 2020-06-18 NOTE — Anesthesia Preprocedure Evaluation (Signed)
Anesthesia Evaluation  Patient identified by MRN, date of birth, ID band Patient awake    Reviewed: Allergy & Precautions, H&P , NPO status , Patient's Chart, lab work & pertinent test results, reviewed documented beta blocker date and time   History of Anesthesia Complications Negative for: history of anesthetic complications  Airway Mallampati: II  TM Distance: >3 FB Neck ROM: full    Dental  (+) Dental Advidsory Given, Caps, Missing, Teeth Intact   Pulmonary neg pulmonary ROS,    Pulmonary exam normal breath sounds clear to auscultation       Cardiovascular Exercise Tolerance: Good hypertension, (-) angina(-) Past MI and (-) Cardiac Stents Normal cardiovascular exam(-) dysrhythmias (-) Valvular Problems/Murmurs Rhythm:regular Rate:Normal     Neuro/Psych negative neurological ROS  negative psych ROS   GI/Hepatic Neg liver ROS, GERD  ,  Endo/Other  neg diabetesHypothyroidism   Renal/GU negative Renal ROS  negative genitourinary   Musculoskeletal   Abdominal   Peds  Hematology negative hematology ROS (+)   Anesthesia Other Findings Past Medical History: No date: Cancer ([place])     Comment:  Thyroid No date: Hyperlipemia No date: Hypertension No date: Insomnia No date: Osteoporosis No date: Personal history of radiation therapy     Comment:  for thyroid No date: Reflux No date: Thyroid disease No date: Vitamin D deficiency   Reproductive/Obstetrics negative OB ROS                             Anesthesia Physical Anesthesia Plan  ASA: II  Anesthesia Plan: General   Post-op Pain Management:    Induction: Intravenous  PONV Risk Score and Plan: 3 and Propofol infusion and TIVA  Airway Management Planned: Natural Airway and Nasal Cannula  Additional Equipment:   Intra-op Plan:   Post-operative Plan:   Informed Consent: I have reviewed the patients History and Physical,  chart, labs and discussed the procedure including the risks, benefits and alternatives for the proposed anesthesia with the patient or authorized representative who has indicated his/her understanding and acceptance.     Dental Advisory Given  Plan Discussed with: Anesthesiologist, CRNA and Surgeon  Anesthesia Plan Comments:         Anesthesia Quick Evaluation

## 2020-06-18 NOTE — H&P (Signed)
Outpatient short stay form Pre-procedure 06/18/2020 11:15 AM Alisha Stevens K. Alice Reichert, M.D.  Primary Physician: Kirkland Hun, M.D.  Reason for visit:  Iron deficiency anemia, personal history of adenomatous colon polyps (2009).  History of present illness:  .Pleasant 79 y/o female presents with iron deficiency anemia with suspected GI source. Denies melena, nausea, vomiting, dysphagia, abdominal pain or hematochezia.                            Patient presents for colonoscopy for a personal hx of colon polyps. The patient denies abdominal pain, abnormal weight loss or rectal bleeding.      Current Facility-Administered Medications:  .  0.9 %  sodium chloride infusion, , Intravenous, Continuous, [place], Benay Pike, MD, Last Rate: 20 mL/hr at 06/18/20 1050, New Bag at 06/18/20 1050  Medications Prior to Admission  Medication Sig Dispense Refill Last Dose  . amLODipine (NORVASC) 2.5 MG tablet    06/18/2020 at 0900  . aspirin 81 MG tablet Take by mouth.   06/17/2020 at Unknown time  . Cholecalciferol (VITAMIN D-1000 MAX ST) 1000 UNITS tablet Take by mouth.   06/17/2020 at Unknown time  . levothyroxine (SYNTHROID, LEVOTHROID) 125 MCG tablet    06/18/2020 at 0730  . meloxicam (MOBIC) 7.5 MG tablet Take 7.5 mg by mouth daily.   06/17/2020 at Unknown time  . omeprazole (PRILOSEC) 40 MG capsule    06/17/2020 at Unknown time  . rosuvastatin (CRESTOR) 10 MG tablet Take 10 mg by mouth daily.   06/17/2020 at Unknown time  . sertraline (ZOLOFT) 50 MG tablet Take 50 mg by mouth daily.   06/17/2020 at Unknown time  . pramipexole (MIRAPEX) 0.125 MG tablet Take 0.125 mg by mouth 3 (three) times daily. (Patient not taking: Reported on 06/18/2020)   Not Taking at Unknown time     Allergies  Allergen Reactions  . Hydralazine Other (See Comments)    Confusion, chest pain   . Neomycin Diarrhea     Past Medical History:  Diagnosis Date  . Cancer ([place])    Thyroid  . Hyperlipemia   . Hypertension   . Insomnia   .  Osteoporosis   . Personal history of radiation therapy    for thyroid  . Reflux   . Thyroid disease   . Vitamin D deficiency     Review of systems:  Otherwise negative.    Physical Exam  Gen: Alert, oriented. Appears stated age.  HEENT: [place]/AT. PERRLA. Lungs: CTA, no wheezes. CV: RR nl S1, S2. Abd: soft, benign, no masses. BS+ Ext: No edema. Pulses 2+    Planned procedures: Proceed with Esophagogastroduodenoscopy and colonoscopy. The patient understands the nature of the planned procedure, indications, risks, alternatives and potential complications including but not limited to bleeding, infection, perforation, damage to internal organs and possible oversedation/side effects from anesthesia. The patient agrees and gives consent to proceed.  Please refer to procedure notes for findings, recommendations and patient disposition/instructions.     Alisha Stevens K. Alice Reichert, M.D. Gastroenterology 06/18/2020  11:15 AM

## 2020-06-18 NOTE — Transfer of Care (Signed)
Immediate Anesthesia Transfer of Care Note  Patient: Alisha Stevens [facility]  Procedure(s) Performed: COLONOSCOPY WITH PROPOFOL (N/A ) ESOPHAGOGASTRODUODENOSCOPY (EGD) WITH PROPOFOL (N/A )  Patient Location: PACU  Anesthesia Type:General  Level of Consciousness: drowsy  Airway & Oxygen Therapy: Patient Spontanous Breathing and Patient connected to nasal cannula oxygen  Post-op Assessment: Report given to RN and Post -op Vital signs reviewed and stable  Post vital signs: Reviewed and stable  Last Vitals:  Vitals Value Taken Time  BP    Temp    Pulse 59 06/18/20 1230  Resp 15 06/18/20 1230  SpO2 96 % 06/18/20 1230  Vitals shown include unvalidated device data.  Last Pain:  Vitals:   06/18/20 1040  TempSrc: Temporal  PainSc: 0-No pain         Complications: No complications documented.

## 2020-06-19 LAB — SURGICAL PATHOLOGY

## 2020-06-21 NOTE — Anesthesia Postprocedure Evaluation (Signed)
Anesthesia Post Note  Patient: Alisha Stevens [facility]  Procedure(s) Performed: COLONOSCOPY WITH PROPOFOL (N/A ) ESOPHAGOGASTRODUODENOSCOPY (EGD) WITH PROPOFOL (N/A )  Patient location during evaluation: Endoscopy Anesthesia Type: General Level of consciousness: awake and alert Pain management: pain level controlled Vital Signs Assessment: post-procedure vital signs reviewed and stable Respiratory status: spontaneous breathing, nonlabored ventilation, respiratory function stable and patient connected to nasal cannula oxygen Cardiovascular status: blood pressure returned to baseline and stable Postop Assessment: no apparent nausea or vomiting Anesthetic complications: no   No complications documented.   Last Vitals:  Vitals:   06/18/20 1250 06/18/20 1300  BP: (!) 156/86 (!) 172/84  Pulse: 56 52  Resp: 15 12  Temp:    SpO2: 100% 100%    Last Pain:  Vitals:   06/19/20 0734  TempSrc:   PainSc: 0-No pain                 Martha Clan

## 2021-04-07 ENCOUNTER — Other Ambulatory Visit: Payer: Self-pay | Admitting: Family Medicine

## 2021-04-07 DIAGNOSIS — Z1231 Encounter for screening mammogram for malignant neoplasm of breast: Secondary | ICD-10-CM

## 2021-04-07 DIAGNOSIS — Z780 Asymptomatic menopausal state: Secondary | ICD-10-CM

## 2021-04-13 ENCOUNTER — Ambulatory Visit
Admission: RE | Admit: 2021-04-13 | Discharge: 2021-04-13 | Disposition: A | Discharge status: Home / Self Care | Payer: Medicare HMO | Source: Ambulatory Visit | Attending: Family Medicine | Admitting: Family Medicine

## 2021-04-13 ENCOUNTER — Other Ambulatory Visit: Payer: Self-pay

## 2021-04-13 DIAGNOSIS — Z1231 Encounter for screening mammogram for malignant neoplasm of breast: Secondary | ICD-10-CM | POA: Diagnosis present

## 2021-04-13 DIAGNOSIS — Z780 Asymptomatic menopausal state: Secondary | ICD-10-CM | POA: Diagnosis present

## 2022-04-05 ENCOUNTER — Other Ambulatory Visit: Payer: Self-pay | Admitting: Family Medicine

## 2022-04-05 DIAGNOSIS — Z1231 Encounter for screening mammogram for malignant neoplasm of breast: Secondary | ICD-10-CM

## 2022-04-14 ENCOUNTER — Other Ambulatory Visit: Payer: Self-pay

## 2022-04-14 ENCOUNTER — Inpatient Hospital Stay
Admission: EM | Admit: 2022-04-14 | Discharge: 2022-04-17 | DRG: 378 | Disposition: A | Discharge status: Home / Self Care | Payer: Medicare HMO | Attending: Student in an Organized Health Care Education/Training Program | Admitting: Student in an Organized Health Care Education/Training Program

## 2022-04-14 DIAGNOSIS — Z791 Long term (current) use of non-steroidal anti-inflammatories (NSAID): Secondary | ICD-10-CM

## 2022-04-14 DIAGNOSIS — K56699 Other intestinal obstruction unspecified as to partial versus complete obstruction: Secondary | ICD-10-CM | POA: Diagnosis present

## 2022-04-14 DIAGNOSIS — I10 Essential (primary) hypertension: Secondary | ICD-10-CM | POA: Diagnosis present

## 2022-04-14 DIAGNOSIS — K5731 Diverticulosis of large intestine without perforation or abscess with bleeding: Principal | ICD-10-CM

## 2022-04-14 DIAGNOSIS — E785 Hyperlipidemia, unspecified: Secondary | ICD-10-CM | POA: Diagnosis present

## 2022-04-14 DIAGNOSIS — Z820 Family history of epilepsy and other diseases of the nervous system: Secondary | ICD-10-CM

## 2022-04-14 DIAGNOSIS — Z823 Family history of stroke: Secondary | ICD-10-CM | POA: Diagnosis not present

## 2022-04-14 DIAGNOSIS — Z79890 Hormone replacement therapy (postmenopausal): Secondary | ICD-10-CM

## 2022-04-14 DIAGNOSIS — K449 Diaphragmatic hernia without obstruction or gangrene: Secondary | ICD-10-CM | POA: Diagnosis present

## 2022-04-14 DIAGNOSIS — K921 Melena: Secondary | ICD-10-CM | POA: Diagnosis not present

## 2022-04-14 DIAGNOSIS — F32A Depression, unspecified: Secondary | ICD-10-CM | POA: Diagnosis present

## 2022-04-14 DIAGNOSIS — M810 Age-related osteoporosis without current pathological fracture: Secondary | ICD-10-CM | POA: Diagnosis present

## 2022-04-14 DIAGNOSIS — Z8249 Family history of ischemic heart disease and other diseases of the circulatory system: Secondary | ICD-10-CM

## 2022-04-14 DIAGNOSIS — E038 Other specified hypothyroidism: Secondary | ICD-10-CM | POA: Diagnosis not present

## 2022-04-14 DIAGNOSIS — M818 Other osteoporosis without current pathological fracture: Secondary | ICD-10-CM | POA: Diagnosis not present

## 2022-04-14 DIAGNOSIS — K641 Second degree hemorrhoids: Secondary | ICD-10-CM | POA: Diagnosis present

## 2022-04-14 DIAGNOSIS — M170 Bilateral primary osteoarthritis of knee: Secondary | ICD-10-CM | POA: Diagnosis present

## 2022-04-14 DIAGNOSIS — Z90710 Acquired absence of both cervix and uterus: Secondary | ICD-10-CM

## 2022-04-14 DIAGNOSIS — Z881 Allergy status to other antibiotic agents status: Secondary | ICD-10-CM | POA: Diagnosis not present

## 2022-04-14 DIAGNOSIS — E890 Postprocedural hypothyroidism: Secondary | ICD-10-CM | POA: Diagnosis present

## 2022-04-14 DIAGNOSIS — Z888 Allergy status to other drugs, medicaments and biological substances status: Secondary | ICD-10-CM | POA: Diagnosis not present

## 2022-04-14 DIAGNOSIS — D62 Acute posthemorrhagic anemia: Secondary | ICD-10-CM | POA: Diagnosis not present

## 2022-04-14 DIAGNOSIS — K625 Hemorrhage of anus and rectum: Principal | ICD-10-CM | POA: Diagnosis present

## 2022-04-14 DIAGNOSIS — Z85850 Personal history of malignant neoplasm of thyroid: Secondary | ICD-10-CM

## 2022-04-14 DIAGNOSIS — Z8371 Family history of colonic polyps: Secondary | ICD-10-CM

## 2022-04-14 DIAGNOSIS — K219 Gastro-esophageal reflux disease without esophagitis: Secondary | ICD-10-CM | POA: Diagnosis present

## 2022-04-14 DIAGNOSIS — Z79899 Other long term (current) drug therapy: Secondary | ICD-10-CM

## 2022-04-14 DIAGNOSIS — Z96641 Presence of right artificial hip joint: Secondary | ICD-10-CM | POA: Diagnosis present

## 2022-04-14 DIAGNOSIS — Z923 Personal history of irradiation: Secondary | ICD-10-CM | POA: Diagnosis not present

## 2022-04-14 DIAGNOSIS — E559 Vitamin D deficiency, unspecified: Secondary | ICD-10-CM | POA: Diagnosis present

## 2022-04-14 DIAGNOSIS — R531 Weakness: Secondary | ICD-10-CM | POA: Diagnosis present

## 2022-04-14 DIAGNOSIS — Z7982 Long term (current) use of aspirin: Secondary | ICD-10-CM

## 2022-04-14 LAB — CBC WITH DIFFERENTIAL/PLATELET
Abs Immature Granulocytes: 0.02 10*3/uL (ref 0.00–0.07)
Basophils Absolute: 0.1 10*3/uL (ref 0.0–0.1)
Basophils Relative: 1 %
Eosinophils Absolute: 0.1 10*3/uL (ref 0.0–0.5)
Eosinophils Relative: 2 %
HCT: 32 % — ABNORMAL LOW (ref 36.0–46.0)
Hemoglobin: 10.2 g/dL — ABNORMAL LOW (ref 12.0–15.0)
Immature Granulocytes: 0 %
Lymphocytes Relative: 35 %
Lymphs Abs: 2.1 10*3/uL (ref 0.7–4.0)
MCH: 26.6 pg (ref 26.0–34.0)
MCHC: 31.9 g/dL (ref 30.0–36.0)
MCV: 83.3 fL (ref 80.0–100.0)
Monocytes Absolute: 0.4 10*3/uL (ref 0.1–1.0)
Monocytes Relative: 6 %
Neutro Abs: 3.3 10*3/uL (ref 1.7–7.7)
Neutrophils Relative %: 56 %
Platelets: 192 10*3/uL (ref 150–400)
RBC: 3.84 MIL/uL — ABNORMAL LOW (ref 3.87–5.11)
RDW: 14.5 % (ref 11.5–15.5)
WBC: 6 10*3/uL (ref 4.0–10.5)
nRBC: 0 % (ref 0.0–0.2)

## 2022-04-14 LAB — COMPREHENSIVE METABOLIC PANEL
ALT: 10 U/L (ref 0–44)
AST: 24 U/L (ref 15–41)
Albumin: 3.5 g/dL (ref 3.5–5.0)
Alkaline Phosphatase: 50 U/L (ref 38–126)
Anion gap: 5 (ref 5–15)
BUN: 27 mg/dL — ABNORMAL HIGH (ref 8–23)
CO2: 24 mmol/L (ref 22–32)
Calcium: 8.4 mg/dL — ABNORMAL LOW (ref 8.9–10.3)
Chloride: 112 mmol/L — ABNORMAL HIGH (ref 98–111)
Creatinine, Ser: 0.79 mg/dL (ref 0.44–1.00)
GFR, Estimated: >60 mL/min (ref >60)
Glucose, Bld: 150 mg/dL — ABNORMAL HIGH (ref 70–99)
Potassium: 4.1 mmol/L (ref 3.5–5.1)
Sodium: 141 mmol/L (ref 135–145)
Total Bilirubin: 0.8 mg/dL (ref 0.3–1.2)
Total Protein: 6.3 g/dL — ABNORMAL LOW (ref 6.5–8.1)

## 2022-04-14 MED ORDER — ASPIRIN 81 MG PO TBEC
81.0000 mg | DELAYED_RELEASE_TABLET | Freq: Every day | ORAL | Status: DC
  Administered 2022-04-15: 81 mg via ORAL
  Filled 2022-04-14: qty 1

## 2022-04-14 MED ORDER — ROSUVASTATIN CALCIUM 10 MG PO TABS
10.0000 mg | ORAL_TABLET | Freq: Every day | ORAL | Status: DC
  Administered 2022-04-15 – 2022-04-17 (×3): 10 mg via ORAL
  Filled 2022-04-14 (×3): qty 1

## 2022-04-14 MED ORDER — ACETAMINOPHEN 325 MG PO TABS
650.0000 mg | ORAL_TABLET | Freq: Four times a day (QID) | ORAL | Status: DC | PRN
  Administered 2022-04-15: 650 mg via ORAL
  Filled 2022-04-14: qty 2

## 2022-04-14 MED ORDER — HYDRALAZINE HCL 20 MG/ML IJ SOLN: 5.0000 mg | INTRAMUSCULAR | Status: DC | PRN

## 2022-04-14 MED ORDER — LEVOTHYROXINE SODIUM 25 MCG PO TABS
125.0000 ug | ORAL_TABLET | Freq: Every day | ORAL | Status: DC
  Administered 2022-04-15 – 2022-04-17 (×2): 125 ug via ORAL
  Filled 2022-04-14 (×2): qty 1

## 2022-04-14 MED ORDER — PANTOPRAZOLE SODIUM 40 MG IV SOLR
40.0000 mg | Freq: Once | INTRAVENOUS | Status: AC
  Administered 2022-04-14: 40 mg via INTRAVENOUS
  Filled 2022-04-14: qty 10

## 2022-04-14 MED ORDER — ONDANSETRON HCL 4 MG PO TABS: 4.0000 mg | ORAL_TABLET | Freq: Four times a day (QID) | ORAL | Status: DC | PRN

## 2022-04-14 MED ORDER — SERTRALINE HCL 50 MG PO TABS
50.0000 mg | ORAL_TABLET | Freq: Every day | ORAL | Status: DC
  Administered 2022-04-15 – 2022-04-17 (×3): 50 mg via ORAL
  Filled 2022-04-14 (×3): qty 1

## 2022-04-14 MED ORDER — ONDANSETRON HCL 4 MG/2ML IJ SOLN
4.0000 mg | Freq: Four times a day (QID) | INTRAMUSCULAR | Status: DC | PRN
  Administered 2022-04-15: 4 mg via INTRAVENOUS
  Filled 2022-04-14: qty 2

## 2022-04-14 MED ORDER — ACETAMINOPHEN 650 MG RE SUPP: 650.0000 mg | Freq: Four times a day (QID) | RECTAL | Status: DC | PRN

## 2022-04-14 NOTE — H&P (Signed)
History and Physical    Patient: Alisha Stevens ZLD:[id number] DOB: 02-Apr-1941 DOA: 04/14/2022 DOS: the patient was seen and examined on 04/15/2022 PCP: Wardell Honour, MD  Patient coming from: Home  Chief Complaint:  Chief Complaint  Patient presents with   Rectal Bleeding   HPI: Alisha Stevens is a 81 y.o. female with medical history significant of hypertension, osteoporosis, arthritis, vitamin D deficiency coming to [place] for rectal bleeding.  Per report the blood is dark and patient has been weak patient came to [place] that she was told by her primary care physician to go to the emergency room she did try going to emergency room in [facility] and waited there for 10 hours and then she came here. Patient does not report headaches blurred vision speech or gait issues chest pain palpitation shortness of breath fevers chills abdominal pain bowel changes urinary issues. 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 Advised patient to stay away from and refrain from using any NSAID products for her arthritis and to use Tylenol based products only.  Discussed with her plan for GI evaluation and IV PPI therapy overnight.   Review of Systems: As mentioned in the history of present illness. All other systems reviewed and are negative.    Past Medical History:  Diagnosis Date   Cancer [facility])    Thyroid   Hyperlipemia    Hypertension    Insomnia    Osteoporosis    Personal history of radiation therapy    for thyroid   Reflux    Thyroid disease    Vitamin D deficiency    Past Surgical History:  Procedure Laterality Date   ABDOMINAL HYSTERECTOMY     BREAST SURGERY     BUNIONECTOMY Right    COLONOSCOPY     COLONOSCOPY WITH PROPOFOL N/A 06/18/2020   Procedure: COLONOSCOPY WITH PROPOFOL;   Surgeon: Toledo, Benay Pike, MD;  Location: [facility] ENDOSCOPY;  Service: Gastroenterology;  Laterality: N/A;   ESOPHAGOGASTRODUODENOSCOPY (EGD) WITH PROPOFOL N/A 06/18/2020   Procedure: ESOPHAGOGASTRODUODENOSCOPY (EGD) WITH PROPOFOL;  Surgeon: Toledo, Benay Pike, MD;  Location: [facility] ENDOSCOPY;  Service: Gastroenterology;  Laterality: N/A;   HIP ARTHROPLASTY Right    retinopexy Right    THYROIDECTOMY     TONSILLECTOMY     Social History:  reports that she has never smoked. She has never used smokeless tobacco. She reports current alcohol use of about 3.0 standard drinks per week. She reports that she does not use drugs.  Allergies  Allergen Reactions   Hydralazine Other (See Comments)    Confusion, chest pain    Neomycin Diarrhea   Lisinopril Cough   Valsartan     Other reaction(s): Dizziness    Family History  Problem Relation Age of Onset   Congestive Heart Failure Mother    Hypertension Mother    Stroke Mother    Colon polyps Father    Heart attack Father    Multiple sclerosis Sister    Alcohol abuse Paternal Grandmother    Hypertension Sister    Breast cancer Neg Hx     Prior to Admission medications   Medication Sig Start Date End Date Taking? Authorizing Provider  amLODipine (NORVASC) 2.5 MG tablet  04/30/15   [provider]  aspirin 81 MG tablet Take by mouth. 12/13/06   [provider]  Cholecalciferol (VITAMIN D-1000 MAX ST) 1000 UNITS tablet Take by mouth.    [provider]  levothyroxine (SYNTHROID, LEVOTHROID) 125 MCG tablet  05/29/15   [provider]  meloxicam (MOBIC) 7.5 MG tablet Take 7.5 mg by mouth daily.    [provider]  omeprazole (PRILOSEC) 40 MG capsule  04/30/15   [provider]  pramipexole (MIRAPEX) 0.125 MG tablet Take 0.125 mg by mouth 3 (three) times daily. Patient not taking: Reported on 06/18/2020    [provider]  rosuvastatin (CRESTOR) 10 MG tablet Take 10 mg by mouth daily.     [provider]  sertraline (ZOLOFT) 50 MG tablet Take 50 mg by mouth daily.    [provider]    Physical Exam: Vitals:   04/14/22 2100 04/14/22 2101  BP: 132/75   Pulse: 66   Resp: 16   Temp: 98.3 F (36.8 C)   TempSrc: Oral   SpO2: 100%   Weight:  67.1 kg  Height:  '5\' 3"'$  (1.6 m)   Physical Exam Vitals and nursing note reviewed.  Constitutional:      General: She is not in acute distress.    Appearance: Normal appearance. She is not ill-appearing, toxic-appearing or diaphoretic.  HENT:     Head: Normocephalic and atraumatic.     Right Ear: Hearing and external ear normal.     Left Ear: Hearing and external ear normal.     Nose: Nose normal. No nasal deformity.     Mouth/Throat:     Lips: Pink.     Mouth: Mucous membranes are moist.     Tongue: No lesions.     Pharynx: Oropharynx is clear.  Eyes:     Extraocular Movements: Extraocular movements intact.     Pupils: Pupils are equal, round, and reactive to light.  Neck:     Vascular: No carotid bruit.  Cardiovascular:     Rate and Rhythm: Normal rate and regular rhythm.     Pulses: Normal pulses.     Heart sounds: Normal heart sounds.  Pulmonary:     Effort: Pulmonary effort is normal.     Breath sounds: Normal breath sounds.  Abdominal:     General: Bowel sounds are normal. There is no distension.     Palpations: Abdomen is soft. There is no mass.     Tenderness: There is no abdominal tenderness. There is no guarding.     Hernia: No hernia is present.  Genitourinary:    Rectum: Guaiac result positive.  Musculoskeletal:     Right lower leg: No edema.     Left lower leg: No edema.  Skin:    General: Skin is warm.  Neurological:     General: No focal deficit present.     Mental Status: She is alert and oriented to person, place, and time.     Cranial Nerves: Cranial nerves 2-12 are intact.     Motor: Motor function is intact.  Psychiatric:        Attention and Perception: Attention normal.         Mood and Affect: Mood normal.        Speech: Speech normal.        Behavior: Behavior normal. Behavior is cooperative.        Cognition and Memory: Cognition normal.      Data Reviewed: Results for orders placed or performed during the hospital encounter of 04/14/22 (from the past 24 hour(s))  CBC with  Differential     Status: Abnormal   Collection Time: 04/14/22  9:51 PM  Result Value Ref Range   WBC 6.0 4.0 - 10.5 K/uL   RBC 3.84 (L) 3.87 - 5.11 MIL/uL   Hemoglobin 10.2 (L) 12.0 - 15.0 g/dL   HCT 32.0 (L) 36.0 - 46.0 %   MCV 83.3 80.0 - 100.0 fL   MCH 26.6 26.0 - 34.0 pg   MCHC 31.9 30.0 - 36.0 g/dL   RDW 14.5 11.5 - 15.5 %   Platelets 192 150 - 400 K/uL   nRBC 0.0 0.0 - 0.2 %   Neutrophils Relative % 56 %   Neutro Abs 3.3 1.7 - 7.7 K/uL   Lymphocytes Relative 35 %   Lymphs Abs 2.1 0.7 - 4.0 K/uL   Monocytes Relative 6 %   Monocytes Absolute 0.4 0.1 - 1.0 K/uL   Eosinophils Relative 2 %   Eosinophils Absolute 0.1 0.0 - 0.5 K/uL   Basophils Relative 1 %   Basophils Absolute 0.1 0.0 - 0.1 K/uL   Immature Granulocytes 0 %   Abs Immature Granulocytes 0.02 0.00 - 0.07 K/uL  Comprehensive metabolic panel     Status: Abnormal   Collection Time: 04/14/22  9:51 PM  Result Value Ref Range   Sodium 141 135 - 145 mmol/L   Potassium 4.1 3.5 - 5.1 mmol/L   Chloride 112 (H) 98 - 111 mmol/L   CO2 24 22 - 32 mmol/L   Glucose, Bld 150 (H) 70 - 99 mg/dL   BUN 27 (H) 8 - 23 mg/dL   Creatinine, Ser 0.79 0.44 - 1.00 mg/dL   Calcium 8.4 (L) 8.9 - 10.3 mg/dL   Total Protein 6.3 (L) 6.5 - 8.1 g/dL   Albumin 3.5 3.5 - 5.0 g/dL   AST 24 15 - 41 U/L   ALT 10 0 - 44 U/L   Alkaline Phosphatase 50 38 - 126 U/L   Total Bilirubin 0.8 0.3 - 1.2 mg/dL   GFR, Estimated >60 >60 mL/min   Anion gap 5 5 - 15  Type and screen [facility]     Status: None (Preliminary result)   Collection Time: 04/14/22  9:51 PM  Result Value Ref Range   ABO/RH(D) PENDING    Antibody  Screen PENDING    Sample Expiration      04/17/2022,2359 Performed at [facility], [address] [place], [place] [address]      Assessment and Plan: * Rectal bleeding Rectal bleeding attribute to chronic long-term NSAID use suspect underlying GI ulcers. A.m. GI consult message sent to Dr. Verl Blalock. IV PPI therapy started. Patient kept n.p.o. after midnight. Discussed with patient to refrain from any and all NSAID products. Monitor patient with telemetry for any tachycardia.  Osteoporosis Pt advised to refrain from bisphosphonate therapy. D/W PCP and endo for non GI options.    Advance Care Planning:    Code Status: Full Code   Consults:  GI Consult.   Family Communication:  Christana, Angelica (Spouse)  [phone] (Home Phone)  Severity of Illness: The appropriate patient status for this patient is OBSERVATION. Observation status is judged to be reasonable and necessary in order to provide the required intensity of service to ensure the patient's safety. The patient's presenting symptoms, physical exam findings, and initial radiographic and laboratory data in the context of their medical condition is felt to place them at decreased risk for further clinical deterioration. Furthermore, it is anticipated that the patient will  be medically stable for discharge from the hospital within 2 midnights of admission.   Author: Para Skeans, MD 04/15/2022 1:05 AM  For on call review [email or web address].

## 2022-04-14 NOTE — ED Triage Notes (Addendum)
Pt presents to ER c/o rectal bleeding that started yesterday around 0400.  Pt states she had a couple episodes of dark red stool Wednesday.  Pt states she was seen at her PCP on Wednesday and was told she should come to ER if it happens again.  Pt endorses a few episodes today while waiting for room at [facility] ER.  Pt has had hemorrhoids in past but does not know if those are what has been causing the bleeding.  Pt states she left [facility] before getting room and came here.  Pt is A&O x4 at this time in NAD.

## 2022-04-14 NOTE — ED Provider Notes (Signed)
[facility] Provider Note    Event Date/Time   First MD Initiated Contact with Patient 04/14/22 2146     (approximate)   History   Rectal Bleeding   HPI  Alisha Stevens is a 81 y.o. female not on any blood thinners with a history of heartburn, diverticulosis, hemorrhoids presents to the ER for several days of progressively worsening dark blood per rectum.  She is feeling weak.  Was seen by her PCP yesterday and told to come to the ER if her symptoms worsen.  She went to the ER in [place] and waited for 10 hours had multiple additional episodes of bloody stool left due to long wait came to this ER.  She denies any specific pain no fevers.  No nausea or vomiting.     Physical Exam   Triage Vital Signs: ED Triage Vitals  Enc Vitals Group     BP 04/14/22 2100 132/75     Pulse Rate 04/14/22 2100 66     Resp 04/14/22 2100 16     Temp 04/14/22 2100 98.3 F (36.8 C)     Temp Source 04/14/22 2100 Oral     SpO2 04/14/22 2100 100 %     Weight 04/14/22 2101 148 lb (67.1 kg)     Height 04/14/22 2101 '5\' 3"'$  (1.6 m)     Head Circumference --      Peak Flow --      Pain Score 04/14/22 2109 7     Pain Loc --      Pain Edu? --      Excl. in [place]? --     Most recent vital signs: Vitals:   04/14/22 2100  BP: 132/75  Pulse: 66  Resp: 16  Temp: 98.3 F (36.8 C)  SpO2: 100%     Constitutional: Alert  Eyes: Conjunctivae are normal.  Head: Atraumatic. Nose: No congestion/rhinnorhea. Mouth/Throat: Mucous membranes are moist.   Neck: Painless ROM.  Cardiovascular:   Good peripheral circulation. Respiratory: Normal respiratory effort.  No retractions.  Gastrointestinal: Soft and nontender.  There is rectal hemorrhoid that does not appear to be acutely bleeding.  There is dark blood on DRE. Musculoskeletal:  no deformity Neurologic:  MAE spontaneously. No gross focal neurologic deficits are appreciated.  Skin:  Skin is warm, dry and intact. No  rash noted. Psychiatric: Mood and affect are normal. Speech and behavior are normal.    ED Results / Procedures / Treatments   Labs (all labs ordered are listed, but only abnormal results are displayed) Labs Reviewed  CBC WITH DIFFERENTIAL/PLATELET - Abnormal; Notable for the following components:      Result Value   RBC 3.84 (*)    Hemoglobin 10.2 (*)    HCT 32.0 (*)    All other components within normal limits  COMPREHENSIVE METABOLIC PANEL  TYPE AND SCREEN  TYPE AND SCREEN     EKG     RADIOLOGY   PROCEDURES:  Critical Care performed:   Procedures   MEDICATIONS ORDERED IN ED: Medications  pantoprazole (PROTONIX) injection 40 mg (40 mg Intravenous Given 04/14/22 2216)     IMPRESSION / MDM / ASSESSMENT AND PLAN / ED COURSE  I reviewed the triage vital signs and the nursing notes.                              Differential diagnosis includes, but is not limited to, UGIB, hemorrhoid,  diverticular bleed, mass, symptomatic anemia, proctitis  Patient presented to the ER for evaluation of symptoms as described above.  This presenting complaint could reflect a potentially life-threatening illness therefore the patient will be placed on continuous pulse oximetry and telemetry for monitoring.  Laboratory evaluation will be sent to evaluate for the above complaints.    On my review of medical records and colonoscopy from 2021 she had evidence of both diverticula as well as hemorrhoids.  Also has history of heartburn therefore will give IV Protonix.    Clinical Course as of 04/14/22 2233  Thu Apr 14, 2022  2229 Patient's hemoglobin continues to fall.  Given her presentation comorbidities do feel she will require hospitalization.  Hospitalist consulted for admission. [PR]    Clinical Course User Index [PR] Merlyn Lot, MD    Patient's presentation is most consistent with acute presentation with potential threat to life or bodily function.   FINAL CLINICAL  IMPRESSION(S) / ED DIAGNOSES   Final diagnoses:  Rectal bleeding     Rx / DC Orders   ED Discharge Orders     None        Note:  This document was prepared using Dragon voice recognition software and may include unintentional dictation errors.    Merlyn Lot, MD 04/14/22 2233

## 2022-04-15 ENCOUNTER — Other Ambulatory Visit: Payer: Medicare HMO

## 2022-04-15 ENCOUNTER — Encounter: Payer: Self-pay | Admitting: Internal Medicine

## 2022-04-15 ENCOUNTER — Encounter
Admission: EM | Disposition: A | Discharge status: Home / Self Care | Payer: Self-pay | Source: Home / Self Care | Attending: Student in an Organized Health Care Education/Training Program

## 2022-04-15 ENCOUNTER — Inpatient Hospital Stay: Payer: Medicare HMO | Admitting: Anesthesiology

## 2022-04-15 DIAGNOSIS — K921 Melena: Secondary | ICD-10-CM | POA: Diagnosis not present

## 2022-04-15 DIAGNOSIS — K219 Gastro-esophageal reflux disease without esophagitis: Secondary | ICD-10-CM | POA: Insufficient documentation

## 2022-04-15 DIAGNOSIS — M170 Bilateral primary osteoarthritis of knee: Secondary | ICD-10-CM

## 2022-04-15 DIAGNOSIS — E038 Other specified hypothyroidism: Secondary | ICD-10-CM

## 2022-04-15 DIAGNOSIS — D62 Acute posthemorrhagic anemia: Secondary | ICD-10-CM | POA: Diagnosis not present

## 2022-04-15 DIAGNOSIS — K625 Hemorrhage of anus and rectum: Secondary | ICD-10-CM | POA: Diagnosis not present

## 2022-04-15 HISTORY — PX: ESOPHAGOGASTRODUODENOSCOPY (EGD) WITH PROPOFOL: SHX5813

## 2022-04-15 LAB — TROPONIN I (HIGH SENSITIVITY): Troponin I (High Sensitivity): 9 ng/L (ref <18)

## 2022-04-15 LAB — TYPE AND SCREEN
ABO/RH(D): O POS
Antibody Screen: NEGATIVE

## 2022-04-15 LAB — T4, FREE: Free T4: 1.06 ng/dL (ref 0.61–1.12)

## 2022-04-15 LAB — TSH: TSH: 2.479 u[IU]/mL (ref 0.350–4.500)

## 2022-04-15 SURGERY — ESOPHAGOGASTRODUODENOSCOPY (EGD) WITH PROPOFOL
Anesthesia: General

## 2022-04-15 MED ORDER — SODIUM CHLORIDE 0.9 % IV SOLN: INTRAVENOUS | Status: DC

## 2022-04-15 MED ORDER — PROPOFOL 10 MG/ML IV BOLUS
INTRAVENOUS | Status: DC | PRN
  Administered 2022-04-15: 50 mg via INTRAVENOUS

## 2022-04-15 MED ORDER — POLYETHYLENE GLYCOL 3350 17 GM/SCOOP PO POWD
1.0000 | Freq: Once | ORAL | Status: DC
Start: 2022-04-15 — End: 2022-04-17
  Filled 2022-04-15: qty 255

## 2022-04-15 MED ORDER — LIDOCAINE 2% (20 MG/ML) 5 ML SYRINGE
INTRAMUSCULAR | Status: DC | PRN
  Administered 2022-04-15: 50 mg via INTRAVENOUS

## 2022-04-15 MED ORDER — SODIUM CHLORIDE 0.9 % IV SOLN
300.0000 mg | Freq: Once | INTRAVENOUS | Status: AC
  Administered 2022-04-15: 300 mg via INTRAVENOUS
  Filled 2022-04-15: qty 300

## 2022-04-15 MED ORDER — PROPOFOL 500 MG/50ML IV EMUL
INTRAVENOUS | Status: DC | PRN
  Administered 2022-04-15: 120 ug/kg/min via INTRAVENOUS

## 2022-04-15 MED ORDER — GLYCOPYRROLATE 0.2 MG/ML IJ SOLN
INTRAMUSCULAR | Status: DC | PRN
  Administered 2022-04-15: .2 mg via INTRAVENOUS

## 2022-04-15 MED ORDER — PANTOPRAZOLE SODIUM 40 MG IV SOLR
40.0000 mg | Freq: Two times a day (BID) | INTRAVENOUS | Status: DC
  Administered 2022-04-15 – 2022-04-17 (×5): 40 mg via INTRAVENOUS
  Filled 2022-04-15 (×5): qty 10

## 2022-04-15 MED ORDER — SODIUM CHLORIDE 0.9 % IV SOLN
INTRAVENOUS | Status: DC
Start: 2022-04-15 — End: 2022-04-15

## 2022-04-15 MED ORDER — DICLOFENAC SODIUM 1 % EX GEL
2.0000 g | Freq: Four times a day (QID) | CUTANEOUS | Status: DC
  Administered 2022-04-15 – 2022-04-17 (×7): 2 g via TOPICAL
  Filled 2022-04-15: qty 100

## 2022-04-15 MED ORDER — OXYCODONE HCL 5 MG PO TABS
5.0000 mg | ORAL_TABLET | Freq: Four times a day (QID) | ORAL | Status: DC | PRN
  Filled 2022-04-15: qty 1

## 2022-04-15 NOTE — Progress Notes (Signed)
PROGRESS NOTE  Alisha Stevens    DOB: 1940-11-30, 81 y.o.  RXV:[id number]    Code Status: Full Code   DOA: 04/14/2022   LOS: 1   Brief hospital course  Alisha Stevens is a 81 y.o. female with a PMH significant for hypertension, osteoporosis, arthritis, hypothyroidism, vitamin D deficiency.  They presented from home to the ED on 04/14/2022 with rectal bleeding x 2 days. She had associated weakness. Takes meloxicam frequently and chronically for bilateral knee arthritis.  In the ED, it was found that they had stable vital signs.  Significant findings included overall normal metabolic panel, and CBC with exception of hgb 11.8 that reduced to 10.2 on recheck.  They were initially treated with IV PPI. GI was consulted.   Patient was admitted to medicine service for further workup and management of presumed GI bleed as outlined in detail below.  04/15/22 -stable  Assessment & Plan  Principal Problem:   Rectal bleeding Active Problems:   Osteoporosis  GI bleed- potential NSAID induced gastritis vs ulcer.  - GI has been consulted. Appreciate recs. Patient is currently NPO in anticipation of possible procedure this am but diet can be added on if no procedure today - continue IV PPI  Arthritis (osteo vs RA)- takes meloxicam chronically - tylenol PRN - voltaren gel - oxy IR for breakthrough pain  HTN  HLD- chronic - continue home statin  Hypothyroidism- chronic - continue home levothyroxine  Depression- chronic, stable - continue home sertraline  Body mass index is 25.88 kg/m.  VTE ppx: SCDs Start: 04/14/22 2322   Diet:     Diet   Diet NPO time specified Except for: Sips with Meds   Consultants: GI Subjective 04/15/22    Pt reports feeling overall fine. She has significant knee pain which is her chronic pain.    Objective   Vitals:   04/15/22 0400 04/15/22 0432 04/15/22 0530 04/15/22 0734  BP: (!) 135/57 131/62 (!) 158/72 140/65   Pulse: (!) 51 67 (!) 50 65  Resp:  '17 16 17  '$ Temp:  98.1 F (36.7 C) (!) 97.1 F (36.2 C) (!) 97.5 F (36.4 C)  TempSrc:  Oral Oral Oral  SpO2: 100%  99% 98%  Weight:   66.3 kg   Height:   '5\' 3"'$  (1.6 m)    No intake or output data in the 24 hours ending 04/15/22 0800 Filed Weights   04/14/22 2101 04/15/22 0530  Weight: 67.1 kg 66.3 kg     Physical Exam:  General: awake, alert, NAD HEENT: atraumatic, clear conjunctiva, anicteric sclera, MMM, hearing grossly normal Respiratory: normal respiratory effort. Cardiovascular: quick capillary refill, normal S1/S2, RRR, no JVD, murmurs Gastrointestinal: soft, NT, ND Nervous: A&O x3. no gross focal neurologic deficits, normal speech Extremities: moves all equally, no edema, normal tone Skin: dry, intact, normal temperature, normal color. No rashes, lesions or ulcers on exposed skin Psychiatry: normal mood, congruent affect  Labs   I have personally reviewed the following labs and imaging studies CBC    Component Value Date/Time   WBC 6.0 04/14/2022 2151   RBC 3.84 (L) 04/14/2022 2151   HGB 10.2 (L) 04/14/2022 2151   HCT 32.0 (L) 04/14/2022 2151   PLT 192 04/14/2022 2151   MCV 83.3 04/14/2022 2151   MCH 26.6 04/14/2022 2151   MCHC 31.9 04/14/2022 2151   RDW 14.5 04/14/2022 2151   LYMPHSABS 2.1 04/14/2022 2151   MONOABS 0.4 04/14/2022 2151   EOSABS 0.1 04/14/2022 2151  BASOSABS 0.1 04/14/2022 2151      Latest Ref Rng & Units 04/14/2022    9:51 PM  BMP  Glucose 70 - 99 mg/dL 150    BUN 8 - 23 mg/dL 27    Creatinine 0.44 - 1.00 mg/dL 0.79    Sodium 135 - 145 mmol/L 141    Potassium 3.5 - 5.1 mmol/L 4.1    Chloride 98 - 111 mmol/L 112    CO2 22 - 32 mmol/L 24    Calcium 8.9 - 10.3 mg/dL 8.4      No results found.  Disposition Plan & Communication  Patient status: Inpatient  Admitted From: Home Planned disposition location: Home Anticipated discharge date: 5/27 pending GI workup and continued stability of  hgb  Family Communication: husband at bedside    Author: Richarda Osmond, DO [facility] 04/15/2022, 8:00 AM   Available by Epic secure chat 7AM-7PM. If 7PM-7AM, please contact night-coverage.  [facility] contact information found on [email or web address].

## 2022-04-15 NOTE — Anesthesia Preprocedure Evaluation (Addendum)
Anesthesia Evaluation  Patient identified by MRN, date of birth, ID band Patient awake    Reviewed: Allergy & Precautions, NPO status , Patient's Chart, lab work & pertinent test results  History of Anesthesia Complications Negative for: history of anesthetic complications  Airway Mallampati: III   Neck ROM: Full    Dental no notable dental hx.    Pulmonary neg pulmonary ROS,    Pulmonary exam normal breath sounds clear to auscultation       Cardiovascular hypertension, Normal cardiovascular exam Rhythm:Regular Rate:Normal     Neuro/Psych PSYCHIATRIC DISORDERS Depression negative neurological ROS     GI/Hepatic GERD  ,  Endo/Other  Hypothyroidism   Renal/GU negative Renal ROS     Musculoskeletal  (+) Arthritis ,   Abdominal   Peds  Hematology negative hematology ROS (+)   Anesthesia Other Findings   Reproductive/Obstetrics                            Anesthesia Physical Anesthesia Plan  ASA: 2  Anesthesia Plan: General   Post-op Pain Management:    Induction: Intravenous  PONV Risk Score and Plan: 3 and Propofol infusion, TIVA and Treatment may vary due to age or medical condition  Airway Management Planned: Natural Airway  Additional Equipment:   Intra-op Plan:   Post-operative Plan:   Informed Consent: I have reviewed the patients History and Physical, chart, labs and discussed the procedure including the risks, benefits and alternatives for the proposed anesthesia with the patient or authorized representative who has indicated his/her understanding and acceptance.       Plan Discussed with: CRNA  Anesthesia Plan Comments: (LMA/GETA backup discussed.  Patient consented for risks of anesthesia including but not limited to:  - adverse reactions to medications - damage to eyes, teeth, lips or other oral mucosa - nerve damage due to positioning  - sore throat or  hoarseness - damage to heart, brain, nerves, lungs, other parts of body or loss of life  Informed patient about role of CRNA in peri- and intra-operative care.  Patient voiced understanding.)        Anesthesia Quick Evaluation

## 2022-04-15 NOTE — TOC Initial Note (Signed)
Transition of Care [facility]) - Initial/Assessment Note    Patient Details  Name: Alisha Stevens MRN: [id number] Date of Birth: 03-08-1941  Transition of Care [facility]) CM/SW Contact:    Laurena Slimmer, RN Phone Number: 04/15/2022, 10:46 AM  Clinical Narrative:                  Transition of Care [facility]) Screening Note   Patient Details  Name: Alisha Stevens Date of Birth: 02-19-1941   Transition of Care [facility]) CM/SW Contact:    Laurena Slimmer, RN Phone Number: 04/15/2022, 10:46 AM    Transition of Care Department [facility]) has reviewed patient and no TOC needs have been identified at this time. We will continue to monitor patient advancement through interdisciplinary progression rounds. If new patient transition needs arise, please place a TOC consult.          Patient Goals and CMS Choice        Expected Discharge Plan and Services                                                Prior Living Arrangements/Services                       Activities of Daily Living Home Assistive Devices/Equipment: Eyeglasses (Reading glasses only) ADL Screening (condition at time of admission) Patient's cognitive ability adequate to safely complete daily activities?: No Is the patient deaf or have difficulty hearing?: No Does the patient have difficulty seeing, even when wearing glasses/contacts?: No Does the patient have difficulty concentrating, remembering, or making decisions?: No Patient able to express need for assistance with ADLs?: Yes Does the patient have difficulty dressing or bathing?: No Independently performs ADLs?: Yes (appropriate for developmental age) Does the patient have difficulty walking or climbing stairs?: No Weakness of Legs: None Weakness of Arms/Hands: None  Permission Sought/Granted                  Emotional Assessment              Admission diagnosis:  Rectal bleeding [K62.5] Patient Active Problem  List   Diagnosis Date Noted   Gastroesophageal reflux disease without esophagitis 04/15/2022   Rectal bleeding 04/14/2022   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   Borderline high serum cholesterol 06/24/2015   BP (high blood pressure) 06/24/2015   Cannot sleep 06/24/2015   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

## 2022-04-15 NOTE — Assessment & Plan Note (Signed)
Rectal bleeding attribute to chronic long-term NSAID use suspect underlying GI ulcers. A.m. GI consult message sent to Dr. Verl Blalock. IV PPI therapy started. Patient kept n.p.o. after midnight. Discussed with patient to refrain from any and all NSAID products. Monitor patient with telemetry for any tachycardia.

## 2022-04-15 NOTE — Progress Notes (Signed)
Patient could not undergo bleeding scan today. Posted for colonoscopy tomorrow AM Bowel prep ordered for today NPO effective 5AM tomorrow  RV

## 2022-04-15 NOTE — Op Note (Signed)
[facility] Patient Name: Alisha Stevens Procedure Date: 04/15/2022 12:57 PM MRN: [id number] Account #: [id number] Date of Birth: [birth year] Admit Type: Inpatient Age: 81 Room: [facility] ENDO ROOM 1 Gender: Female Note Status: Finalized Instrument Name: Altamese Cabal Endoscope [id number] Procedure:             Upper GI endoscopy Indications:           Acute post hemorrhagic anemia, Hematochezia Providers:             Lin Landsman MD, MD Referring MD:          Renette Butters. Tamala Julian, MD (Referring MD) Medicines:             General Anesthesia Complications:         No immediate complications. Estimated blood loss: None. Procedure:             Pre-Anesthesia Assessment:                        - Prior to the procedure, a History and Physical was                         performed, and patient medications and allergies were                         reviewed. The patient is competent. The risks and                         benefits of the procedure and the sedation options and                         risks were discussed with the patient. All questions                         were answered and informed consent was obtained.                         Patient identification and proposed procedure were                         verified by the physician, the nurse, the                         anesthesiologist, the anesthetist and the technician                         in the pre-procedure area in the procedure room in the                         endoscopy suite. Mental Status Examination: alert and                         oriented. Airway Examination: normal oropharyngeal                         airway and neck mobility. Respiratory Examination:                         clear to auscultation. CV Examination: normal.  Prophylactic Antibiotics: The patient does not require                         prophylactic antibiotics. Prior Anticoagulants: The                          patient has taken no previous anticoagulant or                         antiplatelet agents. ASA Grade Assessment: II - A                         patient with mild systemic disease. After reviewing                         the risks and benefits, the patient was deemed in                         satisfactory condition to undergo the procedure. The                         anesthesia plan was to use general anesthesia.                         Immediately prior to administration of medications,                         the patient was re-assessed for adequacy to receive                         sedatives. The heart rate, respiratory rate, oxygen                         saturations, blood pressure, adequacy of pulmonary                         ventilation, and response to care were monitored                         throughout the procedure. The physical status of the                         patient was re-assessed after the procedure.                        After obtaining informed consent, the endoscope was                         passed under direct vision. Throughout the procedure,                         the patient's blood pressure, pulse, and oxygen                         saturations were monitored continuously. The Endoscope                         was introduced through the mouth, and advanced to the  second part of duodenum. The upper GI endoscopy was                         accomplished without difficulty. The patient tolerated                         the procedure well. Findings:      The duodenal bulb and second portion of the duodenum were normal.      A medium-sized hiatal hernia was present.      The entire examined stomach was normal.      The cardia and gastric fundus were normal on retroflexion.      The gastroesophageal junction and examined esophagus were normal. Impression:            - Normal duodenal bulb and second portion of the                          duodenum.                        - Medium-sized hiatal hernia.                        - Normal stomach.                        - Normal gastroesophageal junction and esophagus.                        - No specimens collected. Recommendation:        - Return patient to hospital ward for ongoing care.                        - Clear liquid diet today.                        - Do a GI bleeding (tagged RBC) scan today. Procedure Code(s):     --- Professional ---                        [phone], Esophagogastroduodenoscopy, flexible,                         transoral; diagnostic, including collection of                         specimen(s) by brushing or washing, when performed                         (separate procedure) Diagnosis Code(s):     --- Professional ---                        K44.9, Diaphragmatic hernia without obstruction or                         gangrene                        D62, Acute posthemorrhagic anemia                        K92.1,  Melena (includes Hematochezia) CPT copyright 2019 American Medical Association. All rights reserved. The codes documented in this report are preliminary and upon coder review may  be revised to meet current compliance requirements. Dr. Ulyess Mort Lin Landsman MD, MD 04/15/2022 1:11:09 PM This report has been signed electronically. Number of Addenda: 0 Note Initiated On: 04/15/2022 12:57 PM Estimated Blood Loss:  Estimated blood loss: none.      [facility]

## 2022-04-15 NOTE — Anesthesia Postprocedure Evaluation (Signed)
Anesthesia Post Note  Patient: Cailah Reach [facility]  Procedure(s) Performed: ESOPHAGOGASTRODUODENOSCOPY (EGD) WITH PROPOFOL  Patient location during evaluation: PACU Anesthesia Type: General Level of consciousness: awake and alert, oriented and patient cooperative Pain management: pain level controlled Vital Signs Assessment: post-procedure vital signs reviewed and stable Respiratory status: spontaneous breathing, nonlabored ventilation and respiratory function stable Cardiovascular status: blood pressure returned to baseline and stable Postop Assessment: adequate PO intake Anesthetic complications: no   No notable events documented.   Last Vitals:  Vitals:   04/15/22 1313 04/15/22 1323  BP: (!) 146/78 137/80  Pulse: 71 67  Resp: 14 17  Temp: (!) 36.1 C   SpO2: 98% 97%    Last Pain:  Vitals:   04/15/22 1323  TempSrc:   PainSc: 0-No pain                 Darrin Nipper

## 2022-04-15 NOTE — OR Nursing (Signed)
Patient drinking cola, talking to another patient being d/c. VS stable.  She then started to say her throat was getting dry and she didn't "feel good". A CRNA Bobbye Riggs was giving report in the room beside [place] and came and assessed the patient.  BP spiked 195/103 and continued to stay hypertensive.  HR 103.  Patient c/o pain from left side of neck down left scapula.  No respiratory difficulties.   She did have some confusion and couldn't talk for a brief moment.  Started to return to baseline within 15 min.  EKG ordered and obtained. Cardiac enzymes ordered and obtained. Bleeding scar put on hold until Dr Rosey Bath returns for an evaluation

## 2022-04-15 NOTE — Transfer of Care (Signed)
Immediate Anesthesia Transfer of Care Note  Patient: Alisha Stevens [facility]  Procedure(s) Performed: ESOPHAGOGASTRODUODENOSCOPY (EGD) WITH PROPOFOL  Patient Location: Endoscopy Unit  Anesthesia Type:General  Level of Consciousness: awake, alert  and oriented  Airway & Oxygen Therapy: Patient Spontanous Breathing  Post-op Assessment: Report given to RN and Post -op Vital signs reviewed and stable  Post vital signs: Reviewed  Last Vitals:  Vitals Value Taken Time  BP 146/78 04/15/22 1313  Temp    Pulse 71 04/15/22 1313  Resp 14 04/15/22 1313  SpO2 98 % 04/15/22 1313  Vitals shown include unvalidated device data.  Last Pain:      Patients Stated Pain Goal: 4 (69/86/14 8307)  Complications: No notable events documented.

## 2022-04-15 NOTE — Addendum Note (Signed)
Addendum  created 04/15/22 1402 by Martha Clan, MD   Order list changed, Pharmacy for encounter modified

## 2022-04-15 NOTE — Assessment & Plan Note (Signed)
Pt advised to refrain from bisphosphonate therapy. D/W PCP and endo for non GI options.

## 2022-04-15 NOTE — Consult Note (Addendum)
Cephas Darby, MD [address]  [place]  [place],  [address]  Main: [phone]  Fax: [phone] Pager: [phone]   Consultation  Referring Provider:     No ref. provider found Primary Care Physician:  Wardell Honour, MD Primary Gastroenterologist:  Dr. Alice Reichert Reason for Consultation:     Hematochezia  Date of Admission:  04/14/2022 Date of Consultation:  04/15/2022         HPI:   Alisha Stevens is a 81 y.o. 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  Patient initially went to [facility] ER and got her released and admitted to [facility].  She reports that since Wednesday she has been having several episodes of dark maroon-colored bowel movements, at least a dozen so far.  She reports that she was suffering from constipation prior to onset of hematochezia.  When I saw the patient in the room, she just had a BM left in the commode, it had solid stool covered in burgundy colored blood.  Patient is hemodynamically stable, reports severe fatigue.  Patient had an outpatient visit at [facility] on 5/24, her hemoglobin was 12.4, dropped to 11.8 yesterday at [facility], 10.2 at [facility] ER.  She also had elevated BUN/creatinine.  Patient has known history of hemorrhoids, underwent ligation in the past Colonoscopy was incomplete in 7/21 secondary to tight narrowing in the sigmoid colon, secondary to severe diverticulosis EGD revealed medium size hiatal hernia  Patient's husband is bedside.  Patient is very independent of ADLs and quite active  NSAIDs: Meloxicam  Antiplts/Anticoagulants/Anti thrombotics: None  GI Procedures:  EGD and colonoscopy 06/18/2020  - Normal esophagus. - 3 cm hiatal hernia. - Gastritis. Biopsied. - A single duodenal polyp. Biopsied. - Normal duodenal bulb and third portion of the duodenum. - The examination was  otherwise normal.  - The procedure was aborted due to the unusual difficulty of the procedure. - Internal hemorrhoids that prolapse with straining, but spontaneously regress to the resting position (Grade II) found on perianal exam. - Non-bleeding internal hemorrhoids. - Severe diverticulosis in the mid sigmoid colon. There was narrowing of the colon in association with the diverticular opening. There was no evidence of diverticular bleeding. - No specimens collected.  Past Medical History:  Diagnosis Date   Cancer [facility])    Thyroid   Hyperlipemia    Hypertension    Insomnia    Osteoporosis    Personal history of radiation therapy    for thyroid   Reflux    Thyroid disease    Vitamin D deficiency     Past Surgical History:  Procedure Laterality Date   ABDOMINAL HYSTERECTOMY     BREAST SURGERY     BUNIONECTOMY Right    COLONOSCOPY     COLONOSCOPY WITH PROPOFOL N/A 06/18/2020   Procedure: COLONOSCOPY WITH PROPOFOL;  Surgeon: Toledo, Benay Pike, MD;  Location: [facility] ENDOSCOPY;  Service: Gastroenterology;  Laterality: N/A;   ESOPHAGOGASTRODUODENOSCOPY (EGD) WITH PROPOFOL N/A 06/18/2020   Procedure: ESOPHAGOGASTRODUODENOSCOPY (EGD) WITH PROPOFOL;  Surgeon: Toledo, Benay Pike, MD;  Location: [facility] ENDOSCOPY;  Service: Gastroenterology;  Laterality: N/A;   HIP ARTHROPLASTY Right    retinopexy Right    THYROIDECTOMY     TONSILLECTOMY      Current Facility-Administered Medications:    0.9 %  sodium chloride infusion, , Intravenous, Continuous, Eartha Vonbehren, Tally Due, MD   0.9 %  sodium chloride  infusion, , Intravenous, Continuous, Danette Weinfeld, Tally Due, MD, Last Rate: 10 mL/hr at 04/15/22 1247, New Bag at 04/15/22 1247   [MAR Hold] acetaminophen (TYLENOL) tablet 650 mg, 650 mg, Oral, Q6H PRN, 650 mg at 04/15/22 0627 **OR** [MAR Hold] acetaminophen (TYLENOL) suppository 650 mg, 650 mg, Rectal, Q6H PRN, Para Skeans, MD   [MAR Hold] diclofenac Sodium (VOLTAREN) 1 % topical gel 2 g, 2 g,  Topical, QID, Doristine Mango L, MD, 2 g at 04/15/22 1219   [MAR Hold] levothyroxine (SYNTHROID) tablet 125 mcg, 125 mcg, Oral, Q0600, Para Skeans, MD, 125 mcg at 04/15/22 0627   [MAR Hold] ondansetron (ZOFRAN) tablet 4 mg, 4 mg, Oral, Q6H PRN **OR** [MAR Hold] ondansetron (ZOFRAN) injection 4 mg, 4 mg, Intravenous, Q6H PRN, Para Skeans, MD   [MAR Hold] oxyCODONE (Oxy IR/ROXICODONE) immediate release tablet 5 mg, 5 mg, Oral, Q6H PRN, Richarda Osmond, MD   [MAR Hold] pantoprazole (PROTONIX) injection 40 mg, 40 mg, Intravenous, Q12H, Arika Mainer, Tally Due, MD, 40 mg at 04/15/22 1218   [MAR Hold] rosuvastatin (CRESTOR) tablet 10 mg, 10 mg, Oral, Daily, Florina Ou V, MD, 10 mg at 04/15/22 0847   [MAR Hold] sertraline (ZOLOFT) tablet 50 mg, 50 mg, Oral, Daily, Para Skeans, MD, 50 mg at 04/15/22 0846  Family History  Problem Relation Age of Onset   Congestive Heart Failure Mother    Hypertension Mother    Stroke Mother    Colon polyps Father    Heart attack Father    Multiple sclerosis Sister    Alcohol abuse Paternal Grandmother    Hypertension Sister    Breast cancer Neg Hx      Social History   Tobacco Use   Smoking status: Never   Smokeless tobacco: Never  Vaping Use   Vaping Use: Never used  Substance Use Topics   Alcohol use: Yes    Alcohol/week: 3.0 standard drinks    Types: 3 Glasses of wine per week   Drug use: No    Allergies as of 04/14/2022 - Review Complete 04/14/2022  Allergen Reaction Noted   Hydralazine Other (See Comments) 06/24/2015   Neomycin Diarrhea 06/24/2015   Lisinopril Cough 07/02/2018   Valsartan  10/01/2018    Review of Systems:    All systems reviewed and negative except where noted in HPI.   Physical Exam:  Vital signs in last 24 hours: Temp:  [97.1 F (36.2 C)-98.3 F (36.8 C)] 97.5 F (36.4 C) (05/26 0734) Pulse Rate:  [50-67] 65 (05/26 0734) Resp:  [16-17] 17 (05/26 0734) BP: (122-158)/(57-75) 140/65 (05/26 0734) SpO2:  [97  %-100 %] 98 % (05/26 0734) Weight:  [66.3 kg-67.1 kg] 66.3 kg (05/26 0530)   General:   Pleasant, cooperative in NAD Head:  Normocephalic and atraumatic. Eyes:   No icterus.   Conjunctiva pink. PERRLA. Ears:  Normal auditory acuity. Neck:  Supple; no masses or thyroidomegaly Lungs: Respirations even and unlabored. Lungs clear to auscultation bilaterally.   No wheezes, crackles, or rhonchi.  Heart:  Regular rate and rhythm;  Without murmur, clicks, rubs or gallops Abdomen:  Soft, nondistended, nontender. Normal bowel sounds. No appreciable masses or hepatomegaly.  No rebound or guarding.  Rectal:  Not performed. Msk:  Symmetrical without gross deformities.  Strength generalized weakness Extremities:  Without edema, cyanosis or clubbing. Neurologic:  Alert and oriented x3;  grossly normal neurologically. Skin:  Intact without significant lesions or rashes. Psych:  Alert and cooperative. Normal affect.  LAB RESULTS:  Latest Ref Rng & Units 04/14/2022    9:51 PM  CBC  WBC 4.0 - 10.5 K/uL 6.0    Hemoglobin 12.0 - 15.0 g/dL 10.2    Hematocrit 36.0 - 46.0 % 32.0    Platelets 150 - 400 K/uL 192      BMET    Latest Ref Rng & Units 04/14/2022    9:51 PM  BMP  Glucose 70 - 99 mg/dL 150    BUN 8 - 23 mg/dL 27    Creatinine 0.44 - 1.00 mg/dL 0.79    Sodium 135 - 145 mmol/L 141    Potassium 3.5 - 5.1 mmol/L 4.1    Chloride 98 - 111 mmol/L 112    CO2 22 - 32 mmol/L 24    Calcium 8.9 - 10.3 mg/dL 8.4      LFT    Latest Ref Rng & Units 04/14/2022    9:51 PM  Hepatic Function  Total Protein 6.5 - 8.1 g/dL 6.3    Albumin 3.5 - 5.0 g/dL 3.5    AST 15 - 41 U/L 24    ALT 0 - 44 U/L 10    Alk Phosphatase 38 - 126 U/L 50    Total Bilirubin 0.3 - 1.2 mg/dL 0.8       STUDIES: No results found.    Impression / Plan:   Alisha Stevens is a 81 y.o. female with history of hypertension, hyperlipidemia, history of thyroid cancer s/p ablation, acquired hypothyroidism  presented with 3 days history of hematochezia and acute blood loss anemia, symptomatic anemia  Hematochezia: Elevated BUN/creatinine Recommend EGD for further evaluation, patient has medium sized hiatal hernia, evaluate for Lysbeth Galas lesions/erosions and rule out other peptic ulcer disease If EGD is unremarkable, recommend tagged RBC scan today, if that is positive recommend vascular surgery consultation for embolization.  If it is negative, recommend colonoscopy with either upper endoscope or pediatric colonoscope given tight narrowing of the sigmoid colon and colonoscopy was aborted in 2021 Advised patient to avoid NSAID use Monitor CBC every 6-8 hours and transfuse to maintain hemoglobin greater than 7 Continue Protonix 40 mg IV twice daily Maintain n.p.o.  Thank you for involving me in the care of this patient.  GI will follow along with you    LOS: 1 day   Sherri Sear, MD  04/15/2022, 12:55 PM    Note: This dictation was prepared with Dragon dictation along with smaller phrase technology. Any transcriptional errors that result from this process are unintentional.

## 2022-04-15 NOTE — Plan of Care (Signed)

## 2022-04-16 ENCOUNTER — Encounter: Payer: Self-pay | Admitting: Internal Medicine

## 2022-04-16 ENCOUNTER — Encounter
Admission: EM | Disposition: A | Discharge status: Home / Self Care | Payer: Self-pay | Source: Home / Self Care | Attending: Student in an Organized Health Care Education/Training Program

## 2022-04-16 ENCOUNTER — Inpatient Hospital Stay: Payer: Medicare HMO | Admitting: Anesthesiology

## 2022-04-16 DIAGNOSIS — K5731 Diverticulosis of large intestine without perforation or abscess with bleeding: Secondary | ICD-10-CM | POA: Diagnosis not present

## 2022-04-16 DIAGNOSIS — K625 Hemorrhage of anus and rectum: Secondary | ICD-10-CM | POA: Diagnosis not present

## 2022-04-16 HISTORY — PX: COLONOSCOPY: SHX5424

## 2022-04-16 LAB — CBC
HCT: 28 % — ABNORMAL LOW (ref 36.0–46.0)
Hemoglobin: 8.9 g/dL — ABNORMAL LOW (ref 12.0–15.0)
MCH: 26.3 pg (ref 26.0–34.0)
MCHC: 31.8 g/dL (ref 30.0–36.0)
MCV: 82.8 fL (ref 80.0–100.0)
Platelets: 173 10*3/uL (ref 150–400)
RBC: 3.38 MIL/uL — ABNORMAL LOW (ref 3.87–5.11)
RDW: 14.5 % (ref 11.5–15.5)
WBC: 6.2 10*3/uL (ref 4.0–10.5)
nRBC: 0 % (ref 0.0–0.2)

## 2022-04-16 SURGERY — COLONOSCOPY
Anesthesia: General

## 2022-04-16 MED ORDER — PROPOFOL 500 MG/50ML IV EMUL
INTRAVENOUS | Status: DC | PRN
  Administered 2022-04-16: 30 ug/kg/min via INTRAVENOUS
  Administered 2022-04-16: 20 mg via INTRAVENOUS

## 2022-04-16 MED ORDER — PROPOFOL 10 MG/ML IV BOLUS
INTRAVENOUS | Status: DC | PRN
  Administered 2022-04-16: 75 ug/kg/min via INTRAVENOUS

## 2022-04-16 MED ORDER — LIDOCAINE HCL (CARDIAC) PF 100 MG/5ML IV SOSY
PREFILLED_SYRINGE | INTRAVENOUS | Status: DC | PRN
  Administered 2022-04-16: 40 mg via INTRAVENOUS

## 2022-04-16 MED ORDER — PROPOFOL 10 MG/ML IV BOLUS
INTRAVENOUS | Status: AC
  Filled 2022-04-16: qty 20

## 2022-04-16 MED ORDER — POLYETHYLENE GLYCOL 3350 17 G PO PACK
17.0000 g | PACK | Freq: Two times a day (BID) | ORAL | Status: DC
  Administered 2022-04-17: 17 g via ORAL
  Filled 2022-04-16 (×2): qty 1

## 2022-04-16 MED ORDER — PSYLLIUM 95 % PO PACK
1.0000 | PACK | Freq: Every day | ORAL | Status: DC
  Administered 2022-04-16 – 2022-04-17 (×2): 1 via ORAL
  Filled 2022-04-16 (×2): qty 1

## 2022-04-16 MED ORDER — SODIUM CHLORIDE 0.9 % IV SOLN: INTRAVENOUS | Status: DC

## 2022-04-16 NOTE — Anesthesia Postprocedure Evaluation (Signed)
Anesthesia Post Note  Patient: Alisha Stevens [facility]  Procedure(s) Performed: COLONOSCOPY  Patient location during evaluation: PACU Anesthesia Type: General Level of consciousness: awake and alert Pain management: pain level controlled Respiratory status: spontaneous breathing Cardiovascular status: blood pressure returned to baseline Anesthetic complications: no Comments: No anesthetic issues;   No notable events documented.   Last Vitals:  Vitals:   04/16/22 1033 04/16/22 1045  BP: 123/64 (!) 146/81  Pulse: 61 (!) 57  Resp: 11 20  Temp: (!) 36.3 C   SpO2: 100% 97%    Last Pain:  Vitals:   04/16/22 1045  TempSrc:   PainSc: 0-No pain                 Harrie Foreman

## 2022-04-16 NOTE — Progress Notes (Signed)
Report given to endoscopy RN

## 2022-04-16 NOTE — Anesthesia Preprocedure Evaluation (Signed)
Anesthesia Evaluation  Patient identified by MRN, date of birth, ID band Patient awake    Reviewed: Allergy & Precautions, NPO status , Patient's Chart, lab work & pertinent test results  History of Anesthesia Complications Negative for: history of anesthetic complications  Airway Mallampati: III   Neck ROM: Full    Dental no notable dental hx.    Pulmonary neg pulmonary ROS,    Pulmonary exam normal breath sounds clear to auscultation       Cardiovascular hypertension, Normal cardiovascular exam Rhythm:Regular Rate:Normal     Neuro/Psych PSYCHIATRIC DISORDERS Depression negative neurological ROS     GI/Hepatic GERD  ,  Endo/Other  Hypothyroidism   Renal/GU negative Renal ROS     Musculoskeletal  (+) Arthritis ,   Abdominal   Peds  Hematology negative hematology ROS (+)   Anesthesia Other Findings   Reproductive/Obstetrics                             Anesthesia Physical  Anesthesia Plan  ASA: 2  Anesthesia Plan: General   Post-op Pain Management: Minimal or no pain anticipated   Induction: Intravenous  PONV Risk Score and Plan: 3 and Propofol infusion, TIVA and Treatment may vary due to age or medical condition  Airway Management Planned: Natural Airway  Additional Equipment:   Intra-op Plan:   Post-operative Plan:   Informed Consent: I have reviewed the patients History and Physical, chart, labs and discussed the procedure including the risks, benefits and alternatives for the proposed anesthesia with the patient or authorized representative who has indicated his/her understanding and acceptance.       Plan Discussed with: CRNA  Anesthesia Plan Comments: (LMA/GETA backup discussed.  Patient consented for risks of anesthesia including but not limited to:  - adverse reactions to medications - damage to eyes, teeth, lips or other oral mucosa - nerve damage due to  positioning  - sore throat or hoarseness - damage to heart, brain, nerves, lungs, other parts of body or loss of life  Informed patient about role of CRNA in peri- and intra-operative care.  Patient voiced understanding.)        Anesthesia Quick Evaluation

## 2022-04-16 NOTE — Transfer of Care (Signed)
Immediate Anesthesia Transfer of Care Note  Patient: Alisha Stevens [facility]  Procedure(s) Performed: COLONOSCOPY  Patient Location: PACU  Anesthesia Type:General  Level of Consciousness: awake  Airway & Oxygen Therapy: Patient Spontanous Breathing  Post-op Assessment: Report given to RN  Post vital signs: stable  Last Vitals:  Vitals Value Taken Time  BP 123/64 04/16/22 1033  Temp 36.3 C 04/16/22 1033  Pulse 57 04/16/22 1039  Resp 15 04/16/22 1039  SpO2 100 % 04/16/22 1039  Vitals shown include unvalidated device data.  Last Pain:  Vitals:   04/16/22 1033  TempSrc:   PainSc: Asleep      Patients Stated Pain Goal: 4 (71/59/53 9672)  Complications: No notable events documented.

## 2022-04-16 NOTE — Plan of Care (Signed)

## 2022-04-16 NOTE — H&P (View-Only) (Signed)
Inpatient Follow-up/Progress Note   Patient ID: Alisha Stevens is a 81 y.o. female.  Overnight Events / Subjective Findings [place]. Completed bowel prep o/n. Still noting blood in the stool. Hgb slightly down today from two days ago. Plan for colonoscopy today No other gi complaints.  S/p right hip replacement (remote)  Review of Systems  Constitutional:  Negative for activity change, appetite change, chills, diaphoresis, fatigue, fever and unexpected weight change.  HENT:  Negative for trouble swallowing and voice change.   Respiratory:  Negative for shortness of breath and wheezing.   Cardiovascular:  Negative for chest pain, palpitations and leg swelling.  Gastrointestinal:  Positive for blood in stool. Negative for abdominal distention, abdominal pain, anal bleeding, constipation, diarrhea, nausea, rectal pain and vomiting.  Musculoskeletal:  Negative for arthralgias and myalgias.  Skin:  Negative for color change and pallor.  Neurological:  Negative for dizziness, syncope and weakness.  Psychiatric/Behavioral:  Negative for confusion.   All other systems reviewed and are negative.   Medications  Current Facility-Administered Medications:    [MAR Hold] acetaminophen (TYLENOL) tablet 650 mg, 650 mg, Oral, Q6H PRN, 650 mg at 04/15/22 0627 **OR** [MAR Hold] acetaminophen (TYLENOL) suppository 650 mg, 650 mg, Rectal, Q6H PRN, Para Skeans, MD   [MAR Hold] diclofenac Sodium (VOLTAREN) 1 % topical gel 2 g, 2 g, Topical, QID, Doristine Mango L, MD, 2 g at 04/15/22 2100   [MAR Hold] levothyroxine (SYNTHROID) tablet 125 mcg, 125 mcg, Oral, Q0600, Para Skeans, MD, 125 mcg at 04/15/22 0627   [MAR Hold] ondansetron (ZOFRAN) tablet 4 mg, 4 mg, Oral, Q6H PRN **OR** [MAR Hold] ondansetron (ZOFRAN) injection 4 mg, 4 mg, Intravenous, Q6H PRN, Florina Ou V, MD, 4 mg at 04/15/22 2121   [facility] Hold] oxyCODONE (Oxy IR/ROXICODONE) immediate release tablet 5 mg, 5 mg, Oral, Q6H PRN,  Richarda Osmond, MD   [MAR Hold] pantoprazole (PROTONIX) injection 40 mg, 40 mg, Intravenous, Q12H, Vanga, Tally Due, MD, 40 mg at 04/16/22 0855   [MAR Hold] polyethylene glycol powder (GLYCOLAX/MIRALAX) container 255 g, 1 Container, Oral, Once, Vanga, Tally Due, MD   [facility] Hold] rosuvastatin (CRESTOR) tablet 10 mg, 10 mg, Oral, Daily, Florina Ou V, MD, 10 mg at 04/16/22 0855   [MAR Hold] sertraline (ZOLOFT) tablet 50 mg, 50 mg, Oral, Daily, Florina Ou V, MD, 50 mg at 04/16/22 0855   [MAR Hold] acetaminophen **OR** [MAR Hold] acetaminophen, [MAR Hold] ondansetron **OR** [MAR Hold] ondansetron (ZOFRAN) IV, [MAR Hold] oxyCODONE   Objective    Vitals:   04/15/22 1651 04/15/22 2318 04/16/22 0309 04/16/22 0742  BP: (!) 154/76 (!) 107/58 122/69 (!) 169/70  Pulse:  62 62 (!) 56  Resp:  '18 16 17  '$ Temp:  (!) 97.5 F (36.4 C) 97.8 F (36.6 C) 97.9 F (36.6 C)  TempSrc:  Oral    SpO2:  97% 97% 98%  Weight:      Height:         Physical Exam Vitals and nursing note reviewed.  Constitutional:      General: She is not in acute distress.    Appearance: Normal appearance. She is not ill-appearing, toxic-appearing or diaphoretic.  HENT:     Head: Normocephalic and atraumatic.     Nose: Nose normal.     Mouth/Throat:     Mouth: Mucous membranes are moist.     Pharynx: Oropharynx is clear.  Eyes:     General: No scleral icterus.    Extraocular Movements:  Extraocular movements intact.  Cardiovascular:     Rate and Rhythm: Normal rate and regular rhythm.     Heart sounds: Normal heart sounds. No murmur heard.   No friction rub. No gallop.  Pulmonary:     Effort: Pulmonary effort is normal. No respiratory distress.     Breath sounds: Normal breath sounds. No wheezing, rhonchi or rales.  Abdominal:     General: Bowel sounds are normal. There is no distension.     Palpations: Abdomen is soft.     Tenderness: There is no abdominal tenderness. There is no guarding or rebound.   Musculoskeletal:     Cervical back: Neck supple.     Right lower leg: No edema.     Left lower leg: No edema.  Skin:    General: Skin is warm and dry.     Coloration: Skin is not jaundiced or pale.  Neurological:     General: No focal deficit present.     Mental Status: She is alert and oriented to person, place, and time. Mental status is at baseline.  Psychiatric:        Mood and Affect: Mood normal.        Behavior: Behavior normal.        Thought Content: Thought content normal.        Judgment: Judgment normal.     Laboratory Data Recent Labs  Lab 04/14/22 2151 04/16/22 0437  WBC 6.0 6.2  HGB 10.2* 8.9*  HCT 32.0* 28.0*  PLT 192 173  NEUTOPHILPCT 56  --   LYMPHOPCT 35  --   MONOPCT 6  --   EOSPCT 2  --    Recent Labs  Lab 04/14/22 2151  NA 141  K 4.1  CL 112*  CO2 24  BUN 27*  CREATININE 0.79  CALCIUM 8.4*  PROT 6.3*  BILITOT 0.8  ALKPHOS 50  ALT 10  AST 24  GLUCOSE 150*   No results for input(s): INR in the last 168 hours.    Imaging Studies: No results found.  Assessment:   # hematochezia # Symptomatic ABLA # acquired hypothyroidism # HTN # thyroid ca s/p ablation   Plan:  EGD yesterday negative Colonoscopy planned for today pending patient stability and endoscopy suite availability NPO Bowel prep completed o/n Morning labs reviewed Hold dvt ppx Monitor H&H.  Transfusion and resuscitation as per primary team Avoid frequent lab draws to prevent lab induced anemia Supportive care and antiemetics as per primary team Maintain two sites IV access Avoid nsaids Monitor for GIB.  Colonoscopy with possible biopsy, control of bleeding, polypectomy, and interventions as necessary has been discussed with the patient/patient representative. Informed consent was obtained from the patient/patient representative after explaining the indication, nature, and risks of the procedure including but not limited to death, bleeding, perforation, missed  neoplasm/lesions, cardiorespiratory compromise, and reaction to medications. Opportunity for questions was given and appropriate answers were provided. Patient/patient representative has verbalized understanding is amenable to undergoing the procedure.   I personally performed the service.  Management of other medical comorbidities as per primary team  Thank you for allowing [place] to participate in this patient's care. Please don't hesitate to call if any questions or concerns arise.   Annamaria Helling, DO [facility] Gastroenterology  Portions of the record may have been created with voice recognition software. Occasional wrong-word or 'sound-a-like' substitutions may have occurred due to the inherent limitations of voice recognition software.  Read the chart carefully and recognize,  using context, where substitutions may have occurred.

## 2022-04-16 NOTE — Progress Notes (Signed)
PROGRESS NOTE  Alisha Stevens    DOB: [birth year], 81 y.o.  BOF:[id number]    Code Status: Full Code   DOA: 04/14/2022   LOS: 2   Brief hospital course  Alisha Stevens is a 81 y.o. female with a PMH significant for hypertension, osteoporosis, arthritis, hypothyroidism, vitamin D deficiency.  They presented from home to the ED on 04/14/2022 with rectal bleeding x 2 days. She had associated weakness. Takes meloxicam frequently and chronically for bilateral knee arthritis.  In the ED, it was found that they had stable vital signs.  Significant findings included overall normal metabolic panel, and CBC with exception of hgb 11.8 that reduced to 10.2 on recheck.  They were initially treated with IV PPI. GI was consulted.   Patient was admitted to medicine service for further workup and management of presumed GI bleed as outlined in detail below.  - EGD 5/26- normal - colonoscopy 5/27  04/16/22 -stable  Assessment & Plan  Principal Problem:   Rectal bleeding Active Problems:   Osteoporosis   Other specified hypothyroidism  GI bleed- potential NSAID induced gastritis vs ulcer vs diverticular. Hgb 11.8> 10.2>8.9 - GI has been consulted.   - EGD 5/26- normal  - colonoscopy 5/27- unable to pass scope beyond sigmoid colon due to stricture - continue IV PPI - CBC am - re-attempt RBC scan - daily bowel regimen  Arthritis (osteo vs RA)- takes meloxicam chronically - tylenol PRN - voltaren gel - oxy IR for breakthrough pain  HTN  HLD- chronic - continue home statin  Hypothyroidism- chronic - continue home levothyroxine  Depression- chronic, stable - continue home sertraline  Body mass index is 25.88 kg/m.  VTE ppx: SCDs Start: 04/14/22 2322  Diet:     Diet   Diet NPO time specified Except for: Ice Chips, Sips with Meds   Consultants: GI Subjective 04/16/22    Pt reports feeling well today. She was unable to complete full bowel prep  overnight. She would like to go home if possible. No new bleeding that she has noticed.   Objective   Vitals:   04/15/22 1651 04/15/22 2318 04/16/22 0309 04/16/22 0742  BP: (!) 154/76 (!) 107/58 122/69 (!) 169/70  Pulse:  62 62 (!) 56  Resp:  '18 16 17  '$ Temp:  (!) 97.5 F (36.4 C) 97.8 F (36.6 C) 97.9 F (36.6 C)  TempSrc:  Oral    SpO2:  97% 97% 98%  Weight:      Height:        Intake/Output Summary (Last 24 hours) at 04/16/2022 0808 Last data filed at 04/15/2022 1844 Gross per 24 hour  Intake 1282.1 ml  Output --  Net 1282.1 ml   Filed Weights   04/14/22 2101 04/15/22 0530  Weight: 67.1 kg 66.3 kg     Physical Exam:  General: awake, alert, NAD HEENT: atraumatic, clear conjunctiva, anicteric sclera, MMM, hearing grossly normal Respiratory: normal respiratory effort. Cardiovascular: quick capillary refill, normal S1/S2, RRR, no JVD, murmurs Gastrointestinal: soft, NT, ND Nervous: A&O x3. no gross focal neurologic deficits, normal speech Extremities: moves all equally, no edema, normal tone Skin: dry, intact, normal temperature, normal color. No rashes, lesions or ulcers on exposed skin Psychiatry: normal mood, congruent affect  Labs   I have personally reviewed the following labs and imaging studies CBC    Component Value Date/Time   WBC 6.2 04/16/2022 0437   RBC 3.38 (L) 04/16/2022 0437   HGB 8.9 (L) 04/16/2022 7782  HCT 28.0 (L) 04/16/2022 0437   PLT 173 04/16/2022 0437   MCV 82.8 04/16/2022 0437   MCH 26.3 04/16/2022 0437   MCHC 31.8 04/16/2022 0437   RDW 14.5 04/16/2022 0437   LYMPHSABS 2.1 04/14/2022 2151   MONOABS 0.4 04/14/2022 2151   EOSABS 0.1 04/14/2022 2151   BASOSABS 0.1 04/14/2022 2151      Latest Ref Rng & Units 04/14/2022    9:51 PM  BMP  Glucose 70 - 99 mg/dL 150    BUN 8 - 23 mg/dL 27    Creatinine 0.44 - 1.00 mg/dL 0.79    Sodium 135 - 145 mmol/L 141    Potassium 3.5 - 5.1 mmol/L 4.1    Chloride 98 - 111 mmol/L 112    CO2 22 - 32  mmol/L 24    Calcium 8.9 - 10.3 mg/dL 8.4      No results found.  Disposition Plan & Communication  Patient status: Inpatient  Admitted From: Home Planned disposition location: Home Anticipated discharge date: 5/28 pending GI workup and continued stability of hgb  Family Communication: husband at bedside    Author: Richarda Osmond, DO [facility] 04/16/2022, 8:08 AM   Available by Epic secure chat 7AM-7PM. If 7PM-7AM, please contact night-coverage.  [facility] contact information found on [email or web address].

## 2022-04-16 NOTE — Op Note (Signed)
[facility] Gastroenterology Patient Name: Alisha Stevens Procedure Date: 04/16/2022 9:39 AM MRN: [id number] Account #: [id number] Date of Birth: 03/23/1941 Admit Type: Inpatient Age: 81 Room: [facility] ENDO ROOM 4 Gender: Female Note Status: Finalized Instrument Name: Peds Colonoscope [id number] Procedure:             Colonoscopy Indications:           Hematochezia Providers:             Annamaria Helling DO, DO Medicines:             Monitored Anesthesia Care Complications:         No immediate complications. Estimated blood loss: None. Procedure:             Pre-Anesthesia Assessment:                        - Prior to the procedure, a History and Physical was                         performed, and patient medications and allergies were                         reviewed. The patient is competent. The risks and                         benefits of the procedure and the sedation options and                         risks were discussed with the patient. All questions                         were answered and informed consent was obtained.                         Patient identification and proposed procedure were                         verified by the physician, the nurse, the anesthetist                         and the technician in the endoscopy suite. Mental                         Status Examination: alert and oriented. Airway                         Examination: normal oropharyngeal airway and neck                         mobility. Respiratory Examination: clear to                         auscultation. CV Examination: RRR, no murmurs, no S3                         or S4. Prophylactic Antibiotics: The patient does not                         require prophylactic antibiotics.  Prior                         Anticoagulants: The patient has taken no previous                         anticoagulant or antiplatelet agents. ASA Grade                         Assessment: II  - A patient with mild systemic disease.                         After reviewing the risks and benefits, the patient                         was deemed in satisfactory condition to undergo the                         procedure. The anesthesia plan was to use monitored                         anesthesia care (MAC). Immediately prior to                         administration of medications, the patient was                         re-assessed for adequacy to receive sedatives. The                         heart rate, respiratory rate, oxygen saturations,                         blood pressure, adequacy of pulmonary ventilation, and                         response to care were monitored throughout the                         procedure. The physical status of the patient was                         re-assessed after the procedure.                        After obtaining informed consent, the colonoscope was                         passed under direct vision. Throughout the procedure,                         the patient's blood pressure, pulse, and oxygen                         saturations were monitored continuously. The                         Colonoscope was introduced through the anus with the  intention of advancing to the cecum. The scope was                         advanced to the sigmoid colon before the procedure was                         aborted. Medications were given. The colonoscopy was                         performed with difficulty due to multiple diverticula                         in the colon, poor endoscopic visualization and                         restricted mobility of the colon. Successful                         completion of the procedure was aided by changing the                         patient to a prone position, straightening and                         shortening the scope to obtain bowel loop reduction,                         applying  abdominal pressure and lavage. The patient                         tolerated the procedure well. The quality of the bowel                         preparation was poor. Findings:      Hemorrhoids were found on perianal exam.      Non-bleeding internal hemorrhoids were found during retroflexion and       during perianal exam. The hemorrhoids were Grade II (internal       hemorrhoids that prolapse but reduce spontaneously). Estimated blood       loss: none.      Multiple small-mouthed diverticula were found in the recto-sigmoid colon       and sigmoid colon. Severe diverticular disease with stenosis noted.       Difficult to advance pediatric colonoscope. Only able to reach 35 cm       from anus and further resistance met and procedure was aborted.      Stool and maroon clots were noted. Suspicious for diverticular bleeding.       Estimated blood loss: none. Impression:            - Preparation of the colon was poor.                        - Hemorrhoids found on perianal exam.                        - Non-bleeding internal hemorrhoids.                        -  Diverticulosis in the recto-sigmoid colon and in the                         sigmoid colon.                        - No specimens collected. Recommendation:        - Return patient to hospital ward for ongoing care.                        - Full liquid diet.                        - Continue present medications.                        - Suspicious for diverticular bleeding source. Can                         consider tagged rbc scan if patient continues to have                         drop in hemoglobin. If rbc scan postive, would need to                         consider surgical vs IR intervention as colonoscope                         unable to pass sigmoid area and this was the third                         colonoscopy since 2021 that was unsuccessful.                        - The findings and recommendations were discussed with                          the patient.                        - The findings and recommendations were discussed with                         the patient's family- husband. Procedure Code(s):     --- Professional ---                        [id number], 25, Colonoscopy, flexible; diagnostic,                         including collection of specimen(s) by brushing or                         washing, when performed (separate procedure) Diagnosis Code(s):     --- Professional ---                        K64.1, Second degree hemorrhoids                        K92.1, Melena (includes  Hematochezia)                        K57.30, Diverticulosis of large intestine without                         perforation or abscess without bleeding CPT copyright 2019 American Medical Association. All rights reserved. The codes documented in this report are preliminary and upon coder review may  be revised to meet current compliance requirements. Attending Participation:      I personally performed the entire procedure. Volney American, DO Annamaria Helling DO, DO 04/16/2022 10:35:38 AM This report has been signed electronically. Number of Addenda: 0 Note Initiated On: 04/16/2022 9:39 AM Total Procedure Duration: 0 hours 30 minutes 58 seconds  Estimated Blood Loss:  Estimated blood loss: none.      [facility]

## 2022-04-16 NOTE — Interval H&P Note (Signed)
History and Physical Interval Note: Progress Note from 04/16/22  was reviewed and there was no interval change after seeing and examining the patient.  Written consent was obtained from the patient after discussion of risks, benefits, and alternatives. Patient has consented to proceed with Colonoscopy with possible intervention   04/16/2022 9:46 AM  Alisha Stevens  has presented today for surgery, with the diagnosis of Hematachezia, Acute Blood Loss Anemia.  The various methods of treatment have been discussed with the patient and family. After consideration of risks, benefits and other options for treatment, the patient has consented to  Procedure(s): COLONOSCOPY (N/A) as a surgical intervention.  The patient's history has been reviewed, patient examined, no change in status, stable for surgery.  I have reviewed the patient's chart and labs.  Questions were answered to the patient's satisfaction.     Annamaria Helling

## 2022-04-16 NOTE — Progress Notes (Signed)
Inpatient Follow-up/Progress Note   Patient ID: Alisha Stevens is a 81 y.o. female.  Overnight Events / Subjective Findings Naeon. Completed bowel prep o/n. Still noting blood in the stool. Hgb slightly down today from two days ago. Plan for colonoscopy today No other gi complaints.  S/p right hip replacement (remote)  Review of Systems  Constitutional:  Negative for activity change, appetite change, chills, diaphoresis, fatigue, fever and unexpected weight change.  HENT:  Negative for trouble swallowing and voice change.   Respiratory:  Negative for shortness of breath and wheezing.   Cardiovascular:  Negative for chest pain, palpitations and leg swelling.  Gastrointestinal:  Positive for blood in stool. Negative for abdominal distention, abdominal pain, anal bleeding, constipation, diarrhea, nausea, rectal pain and vomiting.  Musculoskeletal:  Negative for arthralgias and myalgias.  Skin:  Negative for color change and pallor.  Neurological:  Negative for dizziness, syncope and weakness.  Psychiatric/Behavioral:  Negative for confusion.   All other systems reviewed and are negative.   Medications  Current Facility-Administered Medications:    [MAR Hold] acetaminophen (TYLENOL) tablet 650 mg, 650 mg, Oral, Q6H PRN, 650 mg at 04/15/22 0627 **OR** [MAR Hold] acetaminophen (TYLENOL) suppository 650 mg, 650 mg, Rectal, Q6H PRN, Para Skeans, MD   [MAR Hold] diclofenac Sodium (VOLTAREN) 1 % topical gel 2 g, 2 g, Topical, QID, Doristine Mango L, MD, 2 g at 04/15/22 2100   [MAR Hold] levothyroxine (SYNTHROID) tablet 125 mcg, 125 mcg, Oral, Q0600, Para Skeans, MD, 125 mcg at 04/15/22 0627   [MAR Hold] ondansetron (ZOFRAN) tablet 4 mg, 4 mg, Oral, Q6H PRN **OR** [MAR Hold] ondansetron (ZOFRAN) injection 4 mg, 4 mg, Intravenous, Q6H PRN, Florina Ou V, MD, 4 mg at 04/15/22 2121   [facility] Hold] oxyCODONE (Oxy IR/ROXICODONE) immediate release tablet 5 mg, 5 mg, Oral, Q6H PRN,  Richarda Osmond, MD   [MAR Hold] pantoprazole (PROTONIX) injection 40 mg, 40 mg, Intravenous, Q12H, Vanga, Tally Due, MD, 40 mg at 04/16/22 0855   [MAR Hold] polyethylene glycol powder (GLYCOLAX/MIRALAX) container 255 g, 1 Container, Oral, Once, Vanga, Tally Due, MD   [facility] Hold] rosuvastatin (CRESTOR) tablet 10 mg, 10 mg, Oral, Daily, Florina Ou V, MD, 10 mg at 04/16/22 0855   [MAR Hold] sertraline (ZOLOFT) tablet 50 mg, 50 mg, Oral, Daily, Florina Ou V, MD, 50 mg at 04/16/22 0855   [MAR Hold] acetaminophen **OR** [MAR Hold] acetaminophen, [MAR Hold] ondansetron **OR** [MAR Hold] ondansetron (ZOFRAN) IV, [MAR Hold] oxyCODONE   Objective    Vitals:   04/15/22 1651 04/15/22 2318 04/16/22 0309 04/16/22 0742  BP: (!) 154/76 (!) 107/58 122/69 (!) 169/70  Pulse:  62 62 (!) 56  Resp:  '18 16 17  '$ Temp:  (!) 97.5 F (36.4 C) 97.8 F (36.6 C) 97.9 F (36.6 C)  TempSrc:  Oral    SpO2:  97% 97% 98%  Weight:      Height:         Physical Exam Vitals and nursing note reviewed.  Constitutional:      General: She is not in acute distress.    Appearance: Normal appearance. She is not ill-appearing, toxic-appearing or diaphoretic.  HENT:     Head: Normocephalic and atraumatic.     Nose: Nose normal.     Mouth/Throat:     Mouth: Mucous membranes are moist.     Pharynx: Oropharynx is clear.  Eyes:     General: No scleral icterus.    Extraocular Movements:  Extraocular movements intact.  Cardiovascular:     Rate and Rhythm: Normal rate and regular rhythm.     Heart sounds: Normal heart sounds. No murmur heard.   No friction rub. No gallop.  Pulmonary:     Effort: Pulmonary effort is normal. No respiratory distress.     Breath sounds: Normal breath sounds. No wheezing, rhonchi or rales.  Abdominal:     General: Bowel sounds are normal. There is no distension.     Palpations: Abdomen is soft.     Tenderness: There is no abdominal tenderness. There is no guarding or rebound.   Musculoskeletal:     Cervical back: Neck supple.     Right lower leg: No edema.     Left lower leg: No edema.  Skin:    General: Skin is warm and dry.     Coloration: Skin is not jaundiced or pale.  Neurological:     General: No focal deficit present.     Mental Status: She is alert and oriented to person, place, and time. Mental status is at baseline.  Psychiatric:        Mood and Affect: Mood normal.        Behavior: Behavior normal.        Thought Content: Thought content normal.        Judgment: Judgment normal.     Laboratory Data Recent Labs  Lab 04/14/22 2151 04/16/22 0437  WBC 6.0 6.2  HGB 10.2* 8.9*  HCT 32.0* 28.0*  PLT 192 173  NEUTOPHILPCT 56  --   LYMPHOPCT 35  --   MONOPCT 6  --   EOSPCT 2  --    Recent Labs  Lab 04/14/22 2151  NA 141  K 4.1  CL 112*  CO2 24  BUN 27*  CREATININE 0.79  CALCIUM 8.4*  PROT 6.3*  BILITOT 0.8  ALKPHOS 50  ALT 10  AST 24  GLUCOSE 150*   No results for input(s): INR in the last 168 hours.    Imaging Studies: No results found.  Assessment:   # hematochezia # Symptomatic ABLA # acquired hypothyroidism # HTN # thyroid ca s/p ablation   Plan:  EGD yesterday negative Colonoscopy planned for today pending patient stability and endoscopy suite availability NPO Bowel prep completed o/n Morning labs reviewed Hold dvt ppx Monitor H&H.  Transfusion and resuscitation as per primary team Avoid frequent lab draws to prevent lab induced anemia Supportive care and antiemetics as per primary team Maintain two sites IV access Avoid nsaids Monitor for GIB.  Colonoscopy with possible biopsy, control of bleeding, polypectomy, and interventions as necessary has been discussed with the patient/patient representative. Informed consent was obtained from the patient/patient representative after explaining the indication, nature, and risks of the procedure including but not limited to death, bleeding, perforation, missed  neoplasm/lesions, cardiorespiratory compromise, and reaction to medications. Opportunity for questions was given and appropriate answers were provided. Patient/patient representative has verbalized understanding is amenable to undergoing the procedure.   I personally performed the service.  Management of other medical comorbidities as per primary team  Thank you for allowing [place] to participate in this patient's care. Please don't hesitate to call if any questions or concerns arise.   Annamaria Helling, DO [facility]  Portions of the record may have been created with voice recognition software. Occasional wrong-word or 'sound-a-like' substitutions may have occurred due to the inherent limitations of voice recognition software.  Read the chart carefully and recognize,  using context, where substitutions may have occurred.

## 2022-04-17 ENCOUNTER — Inpatient Hospital Stay (HOSPITAL_COMMUNITY): Payer: Medicare HMO

## 2022-04-17 ENCOUNTER — Inpatient Hospital Stay: Payer: Medicare HMO

## 2022-04-17 DIAGNOSIS — M818 Other osteoporosis without current pathological fracture: Secondary | ICD-10-CM | POA: Diagnosis not present

## 2022-04-17 DIAGNOSIS — E038 Other specified hypothyroidism: Secondary | ICD-10-CM | POA: Diagnosis not present

## 2022-04-17 DIAGNOSIS — K625 Hemorrhage of anus and rectum: Secondary | ICD-10-CM | POA: Diagnosis not present

## 2022-04-17 DIAGNOSIS — K5731 Diverticulosis of large intestine without perforation or abscess with bleeding: Secondary | ICD-10-CM | POA: Diagnosis not present

## 2022-04-17 LAB — CBC
HCT: 26.8 % — ABNORMAL LOW (ref 36.0–46.0)
Hemoglobin: 8.5 g/dL — ABNORMAL LOW (ref 12.0–15.0)
MCH: 26.6 pg (ref 26.0–34.0)
MCHC: 31.7 g/dL (ref 30.0–36.0)
MCV: 84 fL (ref 80.0–100.0)
Platelets: 177 10*3/uL (ref 150–400)
RBC: 3.19 MIL/uL — ABNORMAL LOW (ref 3.87–5.11)
RDW: 14.3 % (ref 11.5–15.5)
WBC: 5.1 10*3/uL (ref 4.0–10.5)
nRBC: 0 % (ref 0.0–0.2)

## 2022-04-17 LAB — BASIC METABOLIC PANEL
Anion gap: 5 (ref 5–15)
BUN: 9 mg/dL (ref 8–23)
CO2: 28 mmol/L (ref 22–32)
Calcium: 8.4 mg/dL — ABNORMAL LOW (ref 8.9–10.3)
Chloride: 110 mmol/L (ref 98–111)
Creatinine, Ser: 0.72 mg/dL (ref 0.44–1.00)
GFR, Estimated: >60 mL/min (ref >60)
Glucose, Bld: 82 mg/dL (ref 70–99)
Potassium: 3.6 mmol/L (ref 3.5–5.1)
Sodium: 143 mmol/L (ref 135–145)

## 2022-04-17 MED ORDER — PSYLLIUM 95 % PO PACK: 1.0000 | PACK | Freq: Every day | ORAL | Status: AC

## 2022-04-17 MED ORDER — OLMESARTAN MEDOXOMIL 5 MG PO TABS: 20.0000 mg | ORAL_TABLET | Freq: Every day | ORAL | Status: DC

## 2022-04-17 MED ORDER — ACETAMINOPHEN 325 MG PO TABS: 650.0000 mg | ORAL_TABLET | Freq: Four times a day (QID) | ORAL | Status: AC | PRN

## 2022-04-17 MED ORDER — DICLOFENAC SODIUM 1 % EX GEL: 2.0000 g | Freq: Four times a day (QID) | CUTANEOUS | Status: AC

## 2022-04-17 MED ORDER — AMLODIPINE BESYLATE 5 MG PO TABS
5.0000 mg | ORAL_TABLET | Freq: Every morning | ORAL | Status: DC
  Administered 2022-04-17: 5 mg via ORAL
  Filled 2022-04-17: qty 1

## 2022-04-17 MED ORDER — IOHEXOL 300 MG/ML  SOLN
100.0000 mL | Freq: Once | INTRAMUSCULAR | Status: AC | PRN
  Administered 2022-04-17: 100 mL via INTRAVENOUS

## 2022-04-17 MED ORDER — LORAZEPAM 2 MG/ML IJ SOLN
1.0000 mg | Freq: Once | INTRAMUSCULAR | Status: AC | PRN
  Administered 2022-04-17: 1 mg via INTRAVENOUS
  Filled 2022-04-17: qty 1

## 2022-04-17 MED ORDER — POLYETHYLENE GLYCOL 3350 17 G PO PACK: 17.0000 g | PACK | Freq: Two times a day (BID) | ORAL | 0 refills | Status: AC

## 2022-04-17 MED ORDER — OLMESARTAN MEDOXOMIL 20 MG PO TABS
20.0000 mg | ORAL_TABLET | Freq: Every day | ORAL | Status: DC
  Administered 2022-04-17: 20 mg via ORAL
  Filled 2022-04-17: qty 1

## 2022-04-17 NOTE — Progress Notes (Incomplete)
PROGRESS NOTE  Alisha Stevens    DOB: 07-08-1941, 81 y.o.  ERD:[id number]    Code Status: Full Code   DOA: 04/14/2022   LOS: 3   Brief hospital course  Alisha Stevens is a 81 y.o. female with a PMH significant for hypertension, osteoporosis, arthritis, hypothyroidism, vitamin D deficiency.  They presented from home to the ED on 04/14/2022 with rectal bleeding x 2 days. She had associated weakness. Takes meloxicam frequently and chronically for bilateral knee arthritis.  In the ED, it was found that they had stable vital signs.  Significant findings included overall normal metabolic panel, and CBC with exception of hgb 11.8 that reduced to 10.2 on recheck.  They were initially treated with IV PPI. GI was consulted.   Patient was admitted to medicine service for further workup and management of presumed GI bleed as outlined in detail below.  - EGD 5/26- normal - colonoscopy 5/27  04/17/22 -stable  Assessment & Plan  Principal Problem:   Rectal bleeding Active Problems:   Osteoporosis   Other specified hypothyroidism   Diverticulosis of large intestine with hemorrhage  GI bleed- potential NSAID induced gastritis vs ulcer vs diverticular. Hgb 11.8> 10.2>8.9 - GI has been consulted.   - EGD 5/26- normal  - colonoscopy 5/27- unable to pass scope beyond sigmoid colon due to stricture - continue IV PPI - CBC am - re-attempt RBC scan - daily bowel regimen  Arthritis (osteo vs RA)- takes meloxicam chronically - tylenol PRN - voltaren gel - oxy IR for breakthrough pain  HTN  HLD- chronic - continue home statin  Hypothyroidism- chronic - continue home levothyroxine  Depression- chronic, stable - continue home sertraline  Body mass index is 25.88 kg/m.  VTE ppx: SCDs Start: 04/14/22 2322  Diet:     Diet   Diet clear liquid Room service appropriate? Yes; Fluid consistency: Thin   Consultants: GI Subjective 04/17/22    Pt reports  feeling well today. She was unable to complete full bowel prep overnight. She would like to go home if possible. No new bleeding that she has noticed.   Objective   Vitals:   04/16/22 1626 04/16/22 1931 04/17/22 0019 04/17/22 0421  BP: (!) 149/71 (!) 141/70 (!) 154/65 (!) 166/68  Pulse: (!) 51 61 (!) 53 (!) 54  Resp: '17 17 16 14  '$ Temp: 97.8 F (36.6 C) 98.4 F (36.9 C) (!) 97.5 F (36.4 C) 97.8 F (36.6 C)  TempSrc:  Oral    SpO2: 99% 100% 94% 100%  Weight:      Height:        Intake/Output Summary (Last 24 hours) at 04/17/2022 0746 Last data filed at 04/17/2022 0400 Gross per 24 hour  Intake 640 ml  Output 5 ml  Net 635 ml    Filed Weights   04/14/22 2101 04/15/22 0530  Weight: 67.1 kg 66.3 kg     Physical Exam:  General: awake, alert, NAD HEENT: atraumatic, clear conjunctiva, anicteric sclera, MMM, hearing grossly normal Respiratory: normal respiratory effort. Cardiovascular: quick capillary refill, normal S1/S2, RRR, no JVD, murmurs Gastrointestinal: soft, NT, ND Nervous: A&O x3. no gross focal neurologic deficits, normal speech Extremities: moves all equally, no edema, normal tone Skin: dry, intact, normal temperature, normal color. No rashes, lesions or ulcers on exposed skin Psychiatry: normal mood, congruent affect  Labs   I have personally reviewed the following labs and imaging studies CBC    Component Value Date/Time   WBC 5.1 04/17/2022 0340  RBC 3.19 (L) 04/17/2022 0340   HGB 8.5 (L) 04/17/2022 0340   HCT 26.8 (L) 04/17/2022 0340   PLT 177 04/17/2022 0340   MCV 84.0 04/17/2022 0340   MCH 26.6 04/17/2022 0340   MCHC 31.7 04/17/2022 0340   RDW 14.3 04/17/2022 0340   LYMPHSABS 2.1 04/14/2022 2151   MONOABS 0.4 04/14/2022 2151   EOSABS 0.1 04/14/2022 2151   BASOSABS 0.1 04/14/2022 2151      Latest Ref Rng & Units 04/14/2022    9:51 PM  BMP  Glucose 70 - 99 mg/dL 150    BUN 8 - 23 mg/dL 27    Creatinine 0.44 - 1.00 mg/dL 0.79    Sodium 135 -  145 mmol/L 141    Potassium 3.5 - 5.1 mmol/L 4.1    Chloride 98 - 111 mmol/L 112    CO2 22 - 32 mmol/L 24    Calcium 8.9 - 10.3 mg/dL 8.4      No results found.  Disposition Plan & Communication  Patient status: Inpatient  Admitted From: Home Planned disposition location: Home Anticipated discharge date: 5/28 pending GI workup and continued stability of hgb  Family Communication: husband at bedside    Author: Richarda Osmond, DO [facility] 04/17/2022, 7:46 AM   Available by Epic secure chat 7AM-7PM. If 7PM-7AM, please contact night-coverage.  [facility] contact information found on [email or web address].

## 2022-04-17 NOTE — Progress Notes (Signed)
Inpatient Follow-up/Progress Note   Patient ID: Alisha Stevens is a 81 y.o. female.  Overnight Events / Subjective Findings Naeon. Hgb stable today. Underwent colonoscopy yesterday which was unable to be completed due to stenosis. Scheduled to undergo CT today to evaluate rest of colon/extent of diverticular disease. Tolerating po. No n/v/abdominal pain No other acute gi complaints  Review of Systems  Constitutional:  Negative for activity change, appetite change, chills, diaphoresis, fatigue, fever and unexpected weight change.  HENT:  Negative for trouble swallowing and voice change.   Respiratory:  Negative for shortness of breath and wheezing.   Cardiovascular:  Negative for chest pain, palpitations and leg swelling.  Gastrointestinal:  Positive for blood in stool. Negative for abdominal distention, abdominal pain, anal bleeding, constipation, diarrhea, nausea, rectal pain and vomiting.  Musculoskeletal:  Negative for arthralgias and myalgias.  Skin:  Negative for color change and pallor.  Neurological:  Negative for dizziness, syncope and weakness.  Psychiatric/Behavioral:  Negative for confusion.   All other systems reviewed and are negative.   Medications  Current Facility-Administered Medications:    acetaminophen (TYLENOL) tablet 650 mg, 650 mg, Oral, Q6H PRN, 650 mg at 04/15/22 5784 **OR** acetaminophen (TYLENOL) suppository 650 mg, 650 mg, Rectal, Q6H PRN, Para Skeans, MD   amLODipine (NORVASC) tablet 5 mg, 5 mg, Oral, q morning, Doristine Mango L, MD, 5 mg at 04/17/22 1035   diclofenac Sodium (VOLTAREN) 1 % topical gel 2 g, 2 g, Topical, QID, Doristine Mango L, MD, 2 g at 04/17/22 1109   levothyroxine (SYNTHROID) tablet 125 mcg, 125 mcg, Oral, Q0600, Para Skeans, MD, 125 mcg at 04/17/22 0646   LORazepam (ATIVAN) injection 1 mg, 1 mg, Intravenous, Once PRN, Richarda Osmond, MD   olmesartan (BENICAR) tablet 20 mg, 20 mg, Oral, Daily, Nazari, Walid  A, RPH, 20 mg at 04/17/22 1109   ondansetron (ZOFRAN) tablet 4 mg, 4 mg, Oral, Q6H PRN **OR** ondansetron (ZOFRAN) injection 4 mg, 4 mg, Intravenous, Q6H PRN, Florina Ou V, MD, 4 mg at 04/15/22 2121   oxyCODONE (Oxy IR/ROXICODONE) immediate release tablet 5 mg, 5 mg, Oral, Q6H PRN, Richarda Osmond, MD   pantoprazole (PROTONIX) injection 40 mg, 40 mg, Intravenous, Q12H, Vanga, Tally Due, MD, 40 mg at 04/17/22 1036   polyethylene glycol (MIRALAX / GLYCOLAX) packet 17 g, 17 g, Oral, BID, Doristine Mango L, MD, 17 g at 04/17/22 1029   polyethylene glycol powder (GLYCOLAX/MIRALAX) container 255 g, 1 Container, Oral, Once, Vanga, Tally Due, MD   psyllium (HYDROCIL/METAMUCIL) 1 packet, 1 packet, Oral, Daily, Richarda Osmond, MD, 1 packet at 04/17/22 1028   rosuvastatin (CRESTOR) tablet 10 mg, 10 mg, Oral, Daily, Florina Ou V, MD, 10 mg at 04/17/22 1036   sertraline (ZOLOFT) tablet 50 mg, 50 mg, Oral, Daily, Florina Ou V, MD, 50 mg at 04/17/22 1035   acetaminophen **OR** acetaminophen, LORazepam, ondansetron **OR** ondansetron (ZOFRAN) IV, oxyCODONE   Objective    Vitals:   04/17/22 0019 04/17/22 0421 04/17/22 0821 04/17/22 1139  BP: (!) 154/65 (!) 166/68 (!) 161/82 130/73  Pulse: (!) 53 (!) 54 (!) 49 (!) 51  Resp: 16 14    Temp: (!) 97.5 F (36.4 C) 97.8 F (36.6 C) 98.6 F (37 C) 98.6 F (37 C)  TempSrc:   Oral   SpO2: 94% 100% 99% 96%  Weight:      Height:         Physical Exam Vitals and nursing note reviewed.  Constitutional:      General: She is not in acute distress.    Appearance: Normal appearance. She is not ill-appearing, toxic-appearing or diaphoretic.  HENT:     Head: Normocephalic and atraumatic.     Nose: Nose normal.     Mouth/Throat:     Mouth: Mucous membranes are moist.     Pharynx: Oropharynx is clear.  Eyes:     General: No scleral icterus.    Extraocular Movements: Extraocular movements intact.  Cardiovascular:     Rate and Rhythm:  Normal rate and regular rhythm.     Heart sounds: Normal heart sounds. No murmur heard.   No friction rub. No gallop.  Pulmonary:     Effort: Pulmonary effort is normal. No respiratory distress.     Breath sounds: Normal breath sounds. No wheezing, rhonchi or rales.  Abdominal:     General: Bowel sounds are normal. There is no distension.     Palpations: Abdomen is soft.     Tenderness: There is no abdominal tenderness. There is no guarding or rebound.  Musculoskeletal:     Cervical back: Neck supple.     Right lower leg: No edema.     Left lower leg: No edema.  Skin:    General: Skin is warm and dry.     Coloration: Skin is not jaundiced or pale.  Neurological:     General: No focal deficit present.     Mental Status: She is alert and oriented to person, place, and time. Mental status is at baseline.  Psychiatric:        Mood and Affect: Mood normal.        Behavior: Behavior normal.        Thought Content: Thought content normal.        Judgment: Judgment normal.     Laboratory Data Recent Labs  Lab 04/14/22 2151 04/16/22 0437 04/17/22 0340  WBC 6.0 6.2 5.1  HGB 10.2* 8.9* 8.5*  HCT 32.0* 28.0* 26.8*  PLT 192 173 177  NEUTOPHILPCT 56  --   --   LYMPHOPCT 35  --   --   MONOPCT 6  --   --   EOSPCT 2  --   --     Recent Labs  Lab 04/14/22 2151  NA 141  K 4.1  CL 112*  CO2 24  BUN 27*  CREATININE 0.79  CALCIUM 8.4*  PROT 6.3*  BILITOT 0.8  ALKPHOS 50  ALT 10  AST 24  GLUCOSE 150*    No results for input(s): INR in the last 168 hours.    Imaging Studies: No results found.  Assessment:   # hematochezia- suspect diverticular source # Symptomatic ABLA # acquired hypothyroidism # HTN # thyroid ca s/p ablation   Plan:  EGD negative Colonoscopy demonstrated old blood/clot- suspicious for diverticular bleeding. Due to colonic stenosis and severe diverticulosis- not able to advance scope past sigmoid Should rebleeding occur- recommend CTA or tagged  rbc scan- however, unless distal to sigmoid, would not be able to address endoscopically- (3rd failure to advance past sigmoid in 2 years by 3 different providers). Would recommend IR or surgical evaluation at that time if active bleeding. CT today per primary to evaluate colon and diverticular disease Will take several days for old blood to completely clear out Morning labs reviewed Discussed case with her husband and hospitalist Ok to resume regular diet from gi perspective Monitor H&H.  Transfusion and resuscitation as per primary team Avoid  frequent lab draws to prevent lab induced anemia Supportive care and antiemetics as per primary team Maintain two sites IV access Avoid nsaids Monitor for GIB.  GI to sign off. Available as needed. Please do not hesitate to call regarding questions or concerns.  I personally performed the service.  Management of other medical comorbidities as per primary team  Thank you for allowing [place] to participate in this patient's care.  Annamaria Helling, DO [facility]  Portions of the record may have been created with voice recognition software. Occasional wrong-word or 'sound-a-like' substitutions may have occurred due to the inherent limitations of voice recognition software.  Read the chart carefully and recognize, using context, where substitutions may have occurred.

## 2022-04-17 NOTE — Progress Notes (Signed)
Nsg Discharge Note  Admit Date:  04/14/2022 Discharge date: 04/17/2022   Tekeya Geffert to be D/C'd Home per MD order.  AVS completed.  Patient/caregiver able to verbalize understanding.  Discharge Medication: Allergies as of 04/17/2022       Reactions   Hydralazine Other (See Comments)   Confusion, chest pain    Lisinopril Cough   Neomycin Diarrhea   Valsartan    Other reaction(s): Dizziness        Medication List     STOP taking these medications    meloxicam 7.5 MG tablet Commonly known as: MOBIC       TAKE these medications    acetaminophen 325 MG tablet Commonly known as: TYLENOL Take 2 tablets (650 mg total) by mouth every 6 (six) hours as needed for mild pain (or Fever >/= 101).   amLODipine 5 MG tablet Commonly known as: NORVASC Take 5 mg by mouth every morning.   Cholecalciferol 25 MCG (1000 UT) tablet Take by mouth.   diclofenac Sodium 1 % Gel Commonly known as: VOLTAREN Apply 2 g topically 4 (four) times daily.   levothyroxine 100 MCG tablet Commonly known as: SYNTHROID Take 100 mcg by mouth every morning.   olmesartan 20 MG tablet Commonly known as: BENICAR Take 20 mg by mouth daily.   omeprazole 20 MG capsule Commonly known as: PRILOSEC Take 20 mg by mouth every morning.   polyethylene glycol 17 g packet Commonly known as: MIRALAX / GLYCOLAX Take 17 g by mouth 2 (two) times daily for 30 doses.   pramipexole 0.125 MG tablet Commonly known as: MIRAPEX Take 0.125 mg by mouth 3 (three) times daily.   psyllium 95 % Pack Commonly known as: HYDROCIL/METAMUCIL Take 1 packet by mouth daily. Start taking on: Apr 18, 2022   rosuvastatin 10 MG tablet Commonly known as: CRESTOR Take 10 mg by mouth daily.   sertraline 50 MG tablet Commonly known as: ZOLOFT Take 50 mg by mouth daily.        Discharge Assessment: Vitals:   04/17/22 0821 04/17/22 1139  BP: (!) 161/82 130/73  Pulse: (!) 49 (!) 51  Resp:    Temp: 98.6 F  (37 C) 98.6 F (37 C)  SpO2: 99% 96%   Skin clean, dry and intact without evidence of skin break down, no evidence of skin tears noted. IV catheter discontinued intact. Site without signs and symptoms of complications - no redness or edema noted at insertion site, patient denies c/o pain - only slight tenderness at site.  Dressing with slight pressure applied.  D/c Instructions-Education: Discharge instructions given to patient/family with verbalized understanding. D/c education completed with patient/family including follow up instructions, medication list, d/c activities limitations if indicated, with other d/c instructions as indicated by MD - patient able to verbalize understanding, all questions fully answered. Patient instructed to return to ED, call 911, or call MD for any changes in condition.  Patient escorted via [place], and D/C home via private auto.  Breland Elders, Jolene Schimke, RN 04/17/2022 6:21 PM

## 2022-04-17 NOTE — Discharge Summary (Signed)
Physician Discharge Summary  Patient: Alisha Stevens DXI:[id number] DOB: 04/28/1941   Code Status: Full Code Admit date: 04/14/2022 Discharge date: 04/17/2022 Disposition: Home, No home health services recommended PCP: Wardell Honour, MD  Recommendations for Outpatient Follow-up:  Follow up with PCP within 1 week CBC Follow up with GI  Discharge Diagnoses:  Principal Problem:   Rectal bleeding Active Problems:   Osteoporosis   Other specified hypothyroidism   Diverticulosis of large intestine with hemorrhage  Brief Hospital Course Summary: Alisha Stevens is a 81 y.o. female with a PMH significant for hypertension, osteoporosis, arthritis, hypothyroidism, vitamin D deficiency.   They presented from home to the ED on 04/14/2022 with rectal bleeding x 2 days. She had associated weakness. Takes meloxicam frequently and chronically for bilateral knee arthritis.   In the ED, it was found that they had stable vital signs.  Significant findings included overall normal metabolic panel, and CBC with exception of hgb 11.8 that reduced to 10.2 on recheck.   They were initially treated with IV PPI. GI was consulted.    Patient was admitted to medicine service for further workup and management of presumed GI bleed as outlined in detail below.   - EGD 5/26- normal - colonoscopy 5/27 unsuccessful due to severe constriction of sigmoid colon thought to be due to chronic diverticulosis.  - 5/28 abdominal CT with contrast revealed no acute cause of the bleeding, diffuse colonic diverticulosis. No obvious masses.   Patient had stable hgb throughout stay with gradual decline to 8.5 on day of discharge. She did not require blood transfusions.  Recommended to hold all NSAID medications.  All other chronic conditions were treated with home medications.     Discharge Condition: Stable, improved Recommended discharge diet: Regular healthy  diet  Consultations: GI  Procedures/Studies: EGD Colonoscopy    Allergies as of 04/17/2022       Reactions   Hydralazine Other (See Comments)   Confusion, chest pain    Lisinopril Cough   Neomycin Diarrhea   Valsartan    Other reaction(s): Dizziness        Medication List     STOP taking these medications    meloxicam 7.5 MG tablet Commonly known as: MOBIC       TAKE these medications    acetaminophen 325 MG tablet Commonly known as: TYLENOL Take 2 tablets (650 mg total) by mouth every 6 (six) hours as needed for mild pain (or Fever >/= 101).   amLODipine 5 MG tablet Commonly known as: NORVASC Take 5 mg by mouth every morning.   Cholecalciferol 25 MCG (1000 UT) tablet Take by mouth.   diclofenac Sodium 1 % Gel Commonly known as: VOLTAREN Apply 2 g topically 4 (four) times daily.   levothyroxine 100 MCG tablet Commonly known as: SYNTHROID Take 100 mcg by mouth every morning.   olmesartan 20 MG tablet Commonly known as: BENICAR Take 20 mg by mouth daily.   omeprazole 20 MG capsule Commonly known as: PRILOSEC Take 20 mg by mouth every morning.   polyethylene glycol 17 g packet Commonly known as: MIRALAX / GLYCOLAX Take 17 g by mouth 2 (two) times daily for 30 doses.   pramipexole 0.125 MG tablet Commonly known as: MIRAPEX Take 0.125 mg by mouth 3 (three) times daily.   psyllium 95 % Pack Commonly known as: HYDROCIL/METAMUCIL Take 1 packet by mouth daily. Start taking on: Apr 18, 2022   rosuvastatin 10 MG tablet Commonly known as: CRESTOR Take  10 mg by mouth daily.   sertraline 50 MG tablet Commonly known as: ZOLOFT Take 50 mg by mouth daily.        Subjective   Pt reports feeling well today. Denies any abdominal pain. No known blood loss. Objective  Blood pressure 130/73, pulse (!) 51, temperature 98.6 F (37 C), resp. rate 14, height '5\' 3"'$  (1.6 m), weight 66.3 kg, SpO2 96 %.   General: Pt is alert, awake, not in acute  distress Cardiovascular: RRR, S1/S2 +, no rubs, no gallops Respiratory: CTA bilaterally, no wheezing, no rhonchi Abdominal: Soft, NT, ND, bowel sounds + Extremities: no edema, no cyanosis   The results of significant diagnostics from this hospitalization (including imaging, microbiology, ancillary and laboratory) are listed below for reference.   Imaging studies: CT ABDOMEN PELVIS W CONTRAST  Result Date: 04/17/2022 CLINICAL DATA:  Rectal bleeding for 2 days. EXAM: CT ABDOMEN AND PELVIS WITH CONTRAST TECHNIQUE: Multidetector CT imaging of the abdomen and pelvis was performed using the standard protocol following bolus administration of intravenous contrast. RADIATION DOSE REDUCTION: This exam was performed according to the departmental dose-optimization program which includes automated exposure control, adjustment of the mA and/or kV according to patient size and/or use of iterative reconstruction technique. CONTRAST:  137m OMNIPAQUE IOHEXOL 300 MG/ML  SOLN COMPARISON:  04/24/2014 FINDINGS: Lower chest: Scar versus platelike atelectasis within the lung bases. Hepatobiliary: No suspicious focal liver abnormality. Multiple small stones are identified layering within the gallbladder which measure up to 4 mm. No signs of gallbladder wall thickening or pericholecystic inflammation. No bile duct dilatation. Pancreas: Unremarkable. No pancreatic ductal dilatation or surrounding inflammatory changes. Spleen: Normal in size without focal abnormality. Adrenals/Urinary Tract: Normal appearance of the adrenal glands. No nephrolithiasis, hydronephrosis, or mass identified. No signs of hydroureter or ureteral lithiasis. Bladder appears unremarkable. Stomach/Bowel: Small hiatal hernia identified. No pathologic dilatation of the large or small bowel loops. No signs of acute bowel wall thickening, inflammation, or distension. The appendix is not confidently identified. No pericecal inflammation noted however diffuse  colonic diverticulosis. This appears most severe at the level of the sigmoid colon. Wall thickening of the sigmoid colon is noted without surrounding inflammatory fat stranding, which is favored to represent chronic diverticular disease with circular muscle hypertrophy. Study is not diagnostic for ruling out underlying colon lesion as etiology for patient's lower GI bleeding. Vascular/Lymphatic: Aortic atherosclerosis without aneurysm. No signs of abdominopelvic adenopathy. Reproductive: Status post hysterectomy. No adnexal masses. Other: No free fluid or fluid collections. Fat containing umbilical hernia. No signs of pneumoperitoneum. Musculoskeletal: Previous right hip arthroplasty. 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 No acute or suspicious osseous findings. IMPRESSION: 1. No acute findings identified within the abdomen or pelvis. 2. Diffuse colonic diverticulosis with chronic diverticular disease involving the sigmoid colon. Study is not diagnostic for ruling out underlying colon lesion as etiology for patient's lower GI bleeding. 3. Cholelithiasis. 4. Small hiatal hernia. 5. Fat containing umbilical hernia. 6. Aortic Atherosclerosis (ICD10-I70.0). Electronically Signed   By: TKerby MoorsM.D.   On: 04/17/2022 15:54    Labs: Basic Metabolic Panel: Recent Labs  Lab 04/14/22 2151 04/17/22 1349  NA 141 143  K 4.1 3.6  CL 112* 110  CO2 24 28  GLUCOSE 150* 82  BUN 27* 9  CREATININE 0.79 0.72  CALCIUM 8.4* 8.4*   CBC: Recent Labs  Lab 04/14/22 2151 04/16/22 0437 04/17/22 0340  WBC 6.0 6.2 5.1  NEUTROABS 3.3  --   --  HGB 10.2* 8.9* 8.5*  HCT 32.0* 28.0* 26.8*  MCV 83.3 82.8 84.0  PLT 192 173 177   Microbiology: None   Time coordinating discharge: Over 30 minutes  Richarda Osmond, MD  [facility] 04/17/2022, 5:09 PM

## 2022-04-17 NOTE — Discharge Instructions (Addendum)
Your CT scan showed extensive diverticula in your colon.  Please take daily fiber supplements and stool softeners to help prevent worsening. Follow up with your primary doctor in about a week to recheck your hgb level.

## 2022-04-19 ENCOUNTER — Encounter: Payer: Self-pay | Admitting: Gastroenterology

## 2022-04-19 ENCOUNTER — Ambulatory Visit
Admission: RE | Admit: 2022-04-19 | Discharge: 2022-04-19 | Disposition: A | Discharge status: Home / Self Care | Payer: Medicare HMO | Source: Ambulatory Visit | Attending: Family Medicine | Admitting: Family Medicine

## 2022-04-19 DIAGNOSIS — Z1231 Encounter for screening mammogram for malignant neoplasm of breast: Secondary | ICD-10-CM | POA: Insufficient documentation

## 2022-05-19 ENCOUNTER — Ambulatory Visit
Admission: RE | Admit: 2022-05-19 | Discharge: 2022-05-19 | Disposition: A | Discharge status: Home / Self Care | Payer: Medicare HMO | Source: Ambulatory Visit | Attending: Family Medicine | Admitting: Family Medicine

## 2022-05-19 DIAGNOSIS — Z1231 Encounter for screening mammogram for malignant neoplasm of breast: Secondary | ICD-10-CM | POA: Insufficient documentation

## 2023-06-25 IMAGING — CT CT ABD-PELV W/ CM
2 of 5 series · 15 of 46 positions shown, 17 images · IV contrast (agent unspecified)
Comparison: 04/24/2014

CLINICAL DATA: Rectal bleeding for 2 days.

EXAM:
CT ABDOMEN AND PELVIS WITH CONTRAST
TECHNIQUE: Multidetector CT imaging of the abdomen and pelvis was performed
using the standard protocol following bolus administration of
intravenous contrast.

[Series 2: routine abd/pel with (person_name) · axial · 0.81mm/px · z∈[-1114,-674]mm · 12 of 100 slices shown, 14 images]
[im 6/100  soft-tissue]
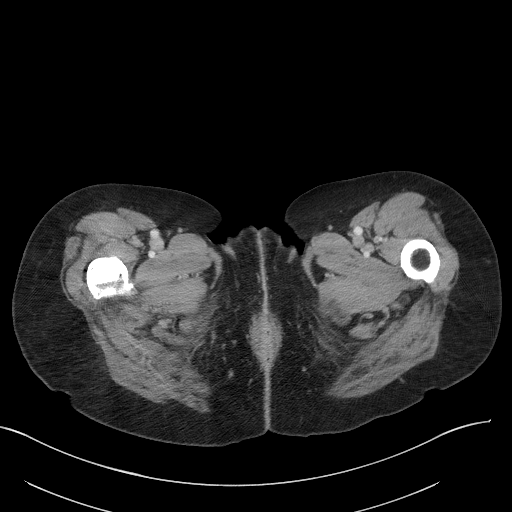
[im 6/100  bone]
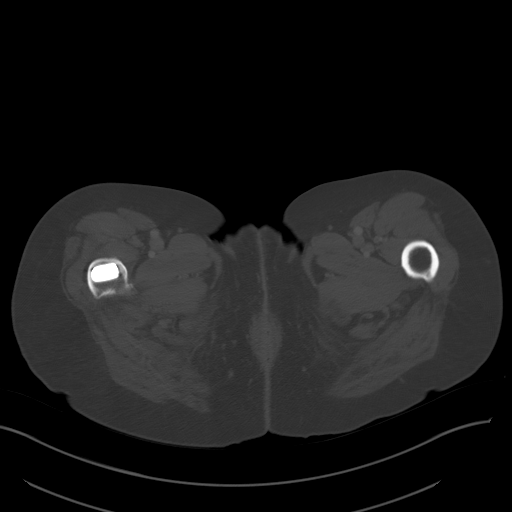
[im 16/100  soft-tissue]
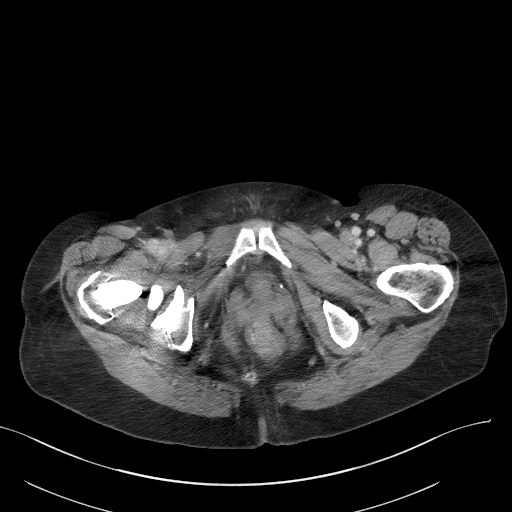
[im 21/100  soft-tissue]
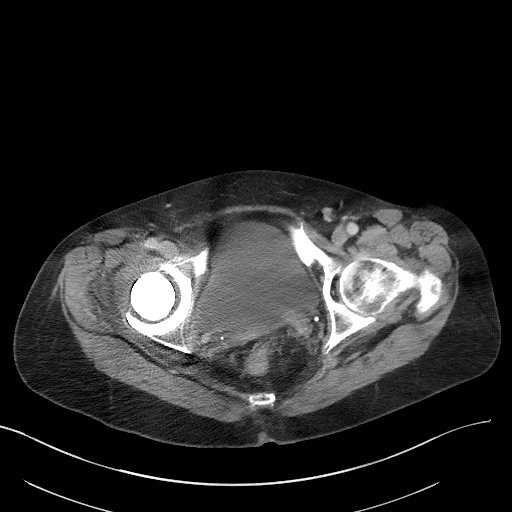
[im 32/100  soft-tissue]
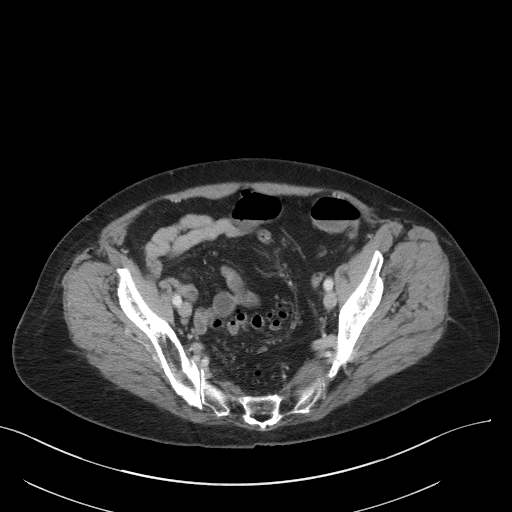
[im 37/100  soft-tissue]
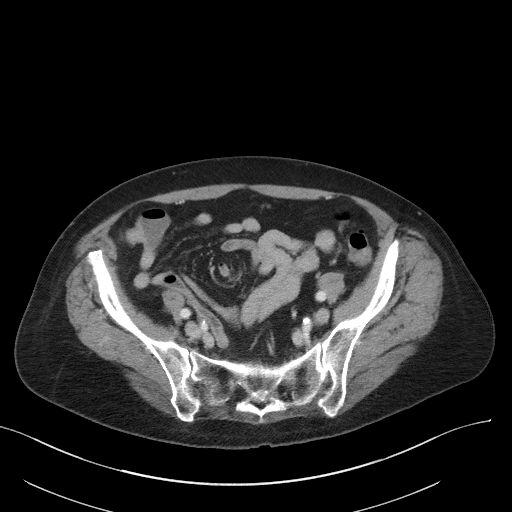
[im 47/100  soft-tissue]
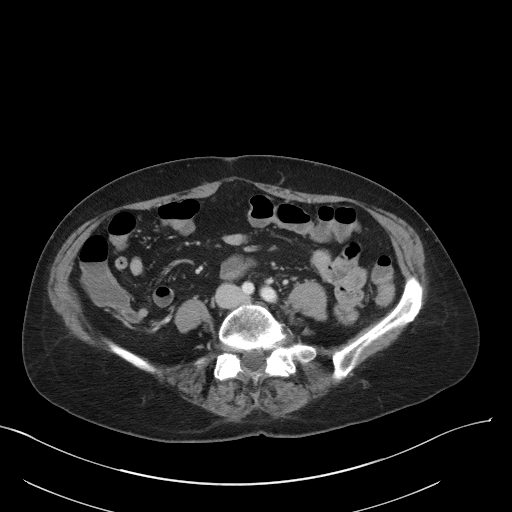
[im 53/100  soft-tissue]
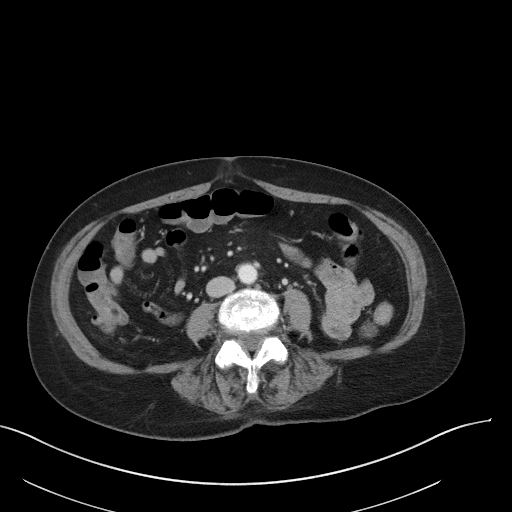
[im 63/100  soft-tissue]
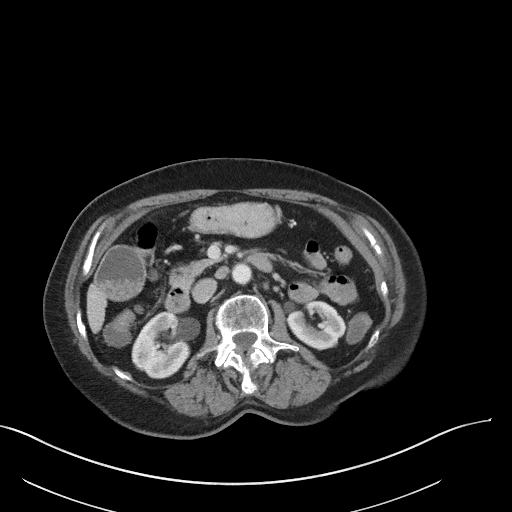
[im 68/100  soft-tissue]
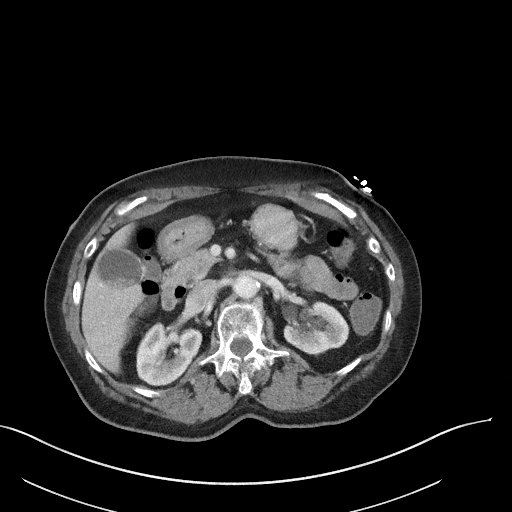
[im 68/100  bone]
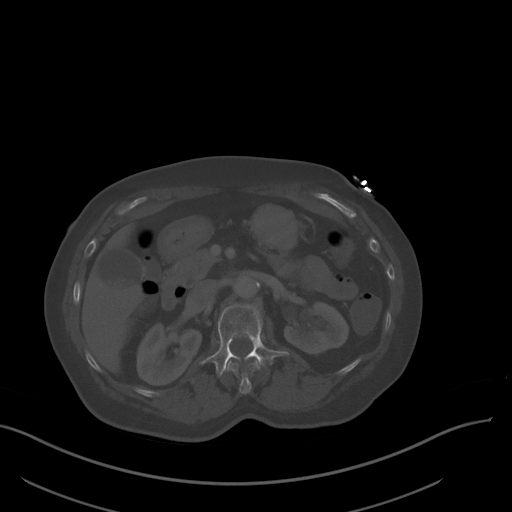
[im 79/100  soft-tissue]
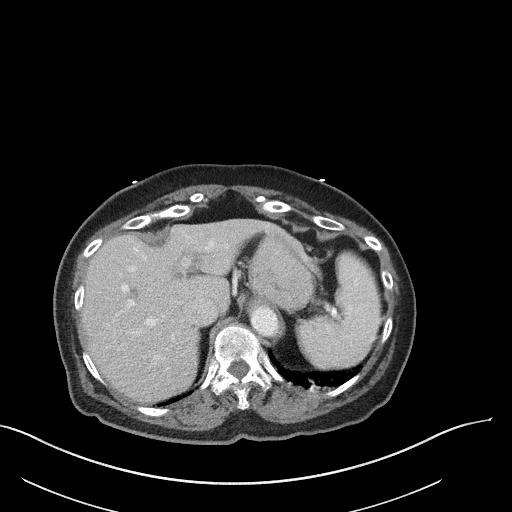
[im 84/100  soft-tissue]
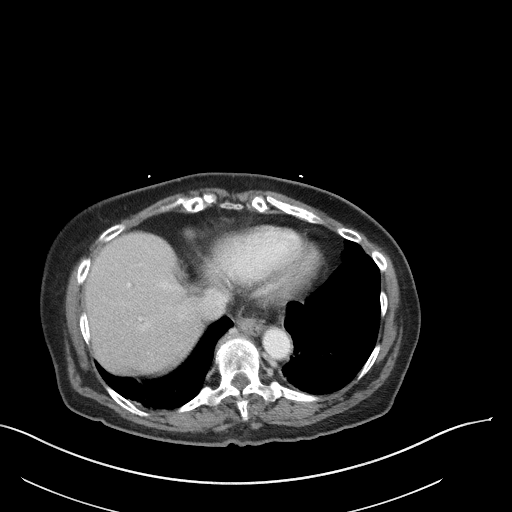
[im 94/100  soft-tissue]
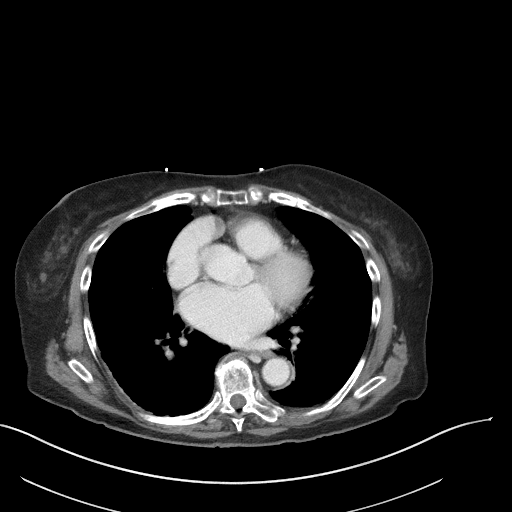

[Series 5: coronal (person_name) · coronal · 0.80mm/px · 3 of 80 slices shown]
[im 27/80  soft-tissue]
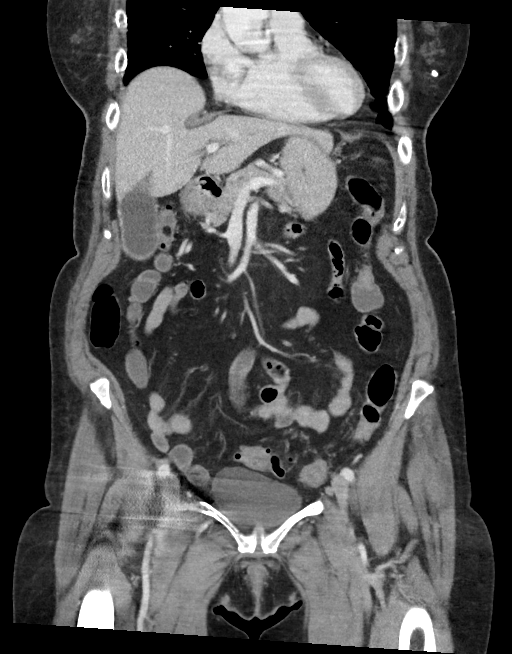
[im 36/80  soft-tissue]
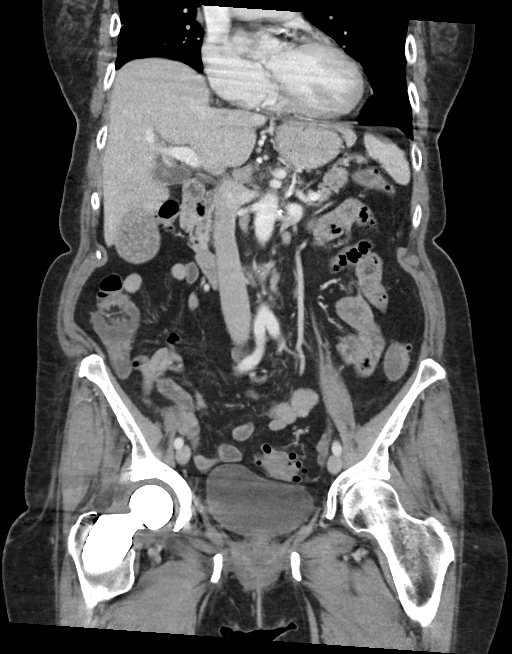
[im 44/80  soft-tissue]
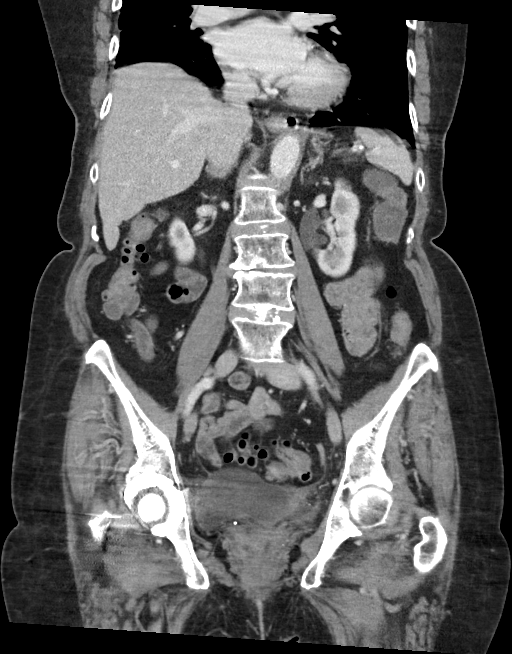

[15 of 46 positions shown; findings below may reference images not displayed]

RADIATION DOSE REDUCTION: This exam was performed according to the
departmental dose-optimization program which includes automated
exposure control, adjustment of the mA and/or kV according to
patient size and/or use of iterative reconstruction technique.

CONTRAST:  100mL OMNIPAQUE IOHEXOL 300 MG/ML  SOLN
FINDINGS: Lower chest: Scar versus platelike atelectasis within the lung
bases.

Hepatobiliary: No suspicious focal liver abnormality. Multiple small
stones are identified layering within the gallbladder which measure
up to 4 mm. No signs of gallbladder wall thickening or
pericholecystic inflammation. No bile duct dilatation.

Pancreas: Unremarkable. No pancreatic ductal dilatation or
surrounding inflammatory changes.

Spleen: Normal in size without focal abnormality.

Adrenals/Urinary Tract: Normal appearance of the adrenal glands. No
nephrolithiasis, hydronephrosis, or mass identified. No signs of
hydroureter or ureteral lithiasis. Bladder appears unremarkable.

Stomach/Bowel: Small hiatal hernia identified. No pathologic
dilatation of the large or small bowel loops. No signs of acute
bowel wall thickening, inflammation, or distension. The appendix is
not confidently identified. No pericecal inflammation noted however
diffuse colonic diverticulosis. This appears most severe at the
level of the sigmoid colon. Wall thickening of the sigmoid colon is
noted without surrounding inflammatory fat stranding, which is
favored to represent chronic diverticular disease with circular
muscle hypertrophy. Study is not diagnostic for ruling out
underlying colon lesion as etiology for patient's lower GI bleeding.

Vascular/Lymphatic: Aortic atherosclerosis without aneurysm. No
signs of abdominopelvic adenopathy.

Reproductive: Status post hysterectomy. No adnexal masses.

Other: No free fluid or fluid collections. Fat containing umbilical
hernia. No signs of pneumoperitoneum.

Musculoskeletal: Previous right hip arthroplasty. Degenerative disc
disease identified within the lumbar spine. No acute or suspicious
osseous findings.
IMPRESSION: 1. No acute findings identified within the abdomen or pelvis.
2. Diffuse colonic diverticulosis with chronic diverticular disease
involving the sigmoid colon. Study is not diagnostic for ruling out
underlying colon lesion as etiology for patient's lower GI bleeding.
3. Cholelithiasis.
4. Small hiatal hernia.
5. Fat containing umbilical hernia.
6. Aortic Atherosclerosis ([id number]).

## 2023-08-21 ENCOUNTER — Ambulatory Visit: Payer: Medicare HMO | Attending: Orthopedic Surgery | Admitting: Physical Therapy

## 2023-08-21 DIAGNOSIS — M6281 Muscle weakness (generalized): Secondary | ICD-10-CM | POA: Insufficient documentation

## 2023-08-21 DIAGNOSIS — M25662 Stiffness of left knee, not elsewhere classified: Secondary | ICD-10-CM | POA: Insufficient documentation

## 2023-08-21 DIAGNOSIS — M25562 Pain in left knee: Secondary | ICD-10-CM | POA: Insufficient documentation

## 2023-08-21 DIAGNOSIS — R262 Difficulty in walking, not elsewhere classified: Secondary | ICD-10-CM | POA: Diagnosis present

## 2023-08-21 NOTE — Therapy (Unsigned)
OUTPATIENT PHYSICAL THERAPY POST-OP EVALUATION  Patient Name: Alisha Stevens MRN: [id number] DOB:1941/05/22, 82 y.o., female Today's Date: 08/21/2023  END OF SESSION:  PT End of Session - 08/22/23 2139     Visit Number 1    Number of Visits 17    Date for PT Re-Evaluation 10/19/23    Authorization Type [employer]    PT Start Time 1122    PT Stop Time 1205    PT Time Calculation (min) 43 min    Equipment Utilized During Treatment --   Pt uses personal front-wheeled walker for mobility   Activity Tolerance Patient tolerated treatment well;Patient limited by pain    Behavior During Therapy WFL for tasks assessed/performed              Past Medical History:  Diagnosis Date   Cancer (HCC)    Thyroid   Hyperlipemia    Hypertension    Insomnia    Osteoporosis    Personal history of radiation therapy    for thyroid   Reflux    Thyroid disease    Vitamin D deficiency    Past Surgical History:  Procedure Laterality Date   ABDOMINAL HYSTERECTOMY     BREAST SURGERY     BUNIONECTOMY Right    COLONOSCOPY     COLONOSCOPY N/A 04/16/2022   Procedure: COLONOSCOPY;  Surgeon: Jaynie Collins, DO;  Location: [facility] ENDOSCOPY;  Service: Gastroenterology;  Laterality: N/A;   COLONOSCOPY WITH PROPOFOL N/A 06/18/2020   Procedure: COLONOSCOPY WITH PROPOFOL;  Surgeon: Toledo, Boykin Nearing, MD;  Location: [facility] ENDOSCOPY;  Service: Gastroenterology;  Laterality: N/A;   ESOPHAGOGASTRODUODENOSCOPY (EGD) WITH PROPOFOL N/A 06/18/2020   Procedure: ESOPHAGOGASTRODUODENOSCOPY (EGD) WITH PROPOFOL;  Surgeon: Toledo, Boykin Nearing, MD;  Location: [facility] ENDOSCOPY;  Service: Gastroenterology;  Laterality: N/A;   ESOPHAGOGASTRODUODENOSCOPY (EGD) WITH PROPOFOL N/A 04/15/2022   Procedure: ESOPHAGOGASTRODUODENOSCOPY (EGD) WITH PROPOFOL;  Surgeon: Toney Reil, MD;  Location: [facility] ENDOSCOPY;  Service: Gastroenterology;  Laterality: N/A;   HIP ARTHROPLASTY Right    retinopexy Right     THYROIDECTOMY     TONSILLECTOMY     Patient Active Problem List   Diagnosis Date Noted   Diverticulosis of large intestine with hemorrhage    Gastroesophageal reflux disease without esophagitis 04/15/2022   Other specified hypothyroidism    Rectal bleeding 04/14/2022   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   Borderline high serum cholesterol 06/24/2015   BP (high blood pressure) 06/24/2015   Cannot sleep 06/24/2015   Carcinoma of thyroid (HCC) 06/24/2015   Reflux 06/24/2015   Osteoporosis 06/24/2015   Arthritis, degenerative 06/24/2015   Abdominal pain 05/01/2014   Constipation 05/01/2014   Diarrhea 05/01/2014   Foreign body of rectum 05/01/2014   Avitaminosis D 10/24/2012   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 Unknown Foley, MD  FOLLOW-UP APPT SCHEDULED WITH REFERRING PROVIDER: Yes ; f/u with surgeon 09/13/23   SUBJECTIVE:  SUBJECTIVE STATEMENT:  Pt is an 82 year old female s/p L TKA 08/01/23.   PERTINENT HISTORY: Pt is an 82 year old female s/p L TKA 08/01/23. Patient reports lot of swelling. Patient reports no significant wound care issues. Pt reports no home health PT since surgery. Patient reports hx of diabetic neuropathy with some numbness in foot/toes. Pt has some numbness around incision. Past Med hx of HTN, osteoporosis (lower bone density in lumbar spine), OA,  post-operative hypothyroidism. Pt reports pain is fairly well controlled with Tramadol; 2x/day and pt is also using Tylenol. Pt reports mild pain at rest. Pt reports doing well with car transfers, getting in/out of house, getting around house. Hx of R THA.  PAIN:   Pain Intensity: Present: 3-4/10, Best: 1/10, Worst: 8/10 Pain location: medial knee complex, along "path of incision" Pain quality:  burning, throbbing    Swelling: Yes , L knee and leg  Numbness/Tingling: Yes; some numbness next to incision, neuropathy in feet  Focal weakness or buckling: No, pt able to ambulate with FWW  Aggravating factors: riding in car, bending knee, transfers e.g. up/down from chair  Relieving factors: Tramadol, Tylenol, cold pack/ice machine   History of prior back, hip, or knee injury, pain, surgery, or therapy: No  Imaging: Yes ; good post-op radiographs   Prior level of function: Independent with community mobility with device Occupational demands: Retired  Presenter, broadcasting: Back to walking exercise program, DIY projects around house  Red flags: Negative for personal history of cancer, chills/fever, night sweats, nausea, vomiting, unexplained weight gain/loss, unrelenting pain  PRECAUTIONS: None  WEIGHT BEARING RESTRICTIONS:  WBAT  FALLS: Has patient fallen in last 6 months? No  Living Environment Lives with: lives with their spouse and her son  Lives in: House/apartment Built in seat shower, safety strips on shower floor, suction grab bar; raised commode. Only small threshold into home from garage. They have upstairs, but everything she uses is on first level.    Patient Goals: return to normal walking, walking routine (up to 1 mi previously, 2 years ago); able to return to exercise classes (e.g. chair aerobics); able to travel (further in car)   OBJECTIVE:   Patient Surveys  FOTO 37, predicted outcome score of 38  Cognition Patient is oriented to person, place, and time.  Recent memory is  intact.  Remote memory is intact.  Attention span and concentration are intact.  Expressive speech is intact.  Patient's fund of knowledge is within normal limits for educational level.    Gross Musculoskeletal Assessment Tremor: None Bulk: Normal Tone: Normal  GAIT: Distance walked: 50 ft Assistive device utilized: Walker - 2 wheeled Level of assistance: SBA Comments: Dec L knee terminal knee extension at terminal swing, good heel to toe progression and normal step length, moderate forward lean onto front-wheeled walker, mild dec stance time LLE   AROM AROM (Normal range in degrees) AROM  Hip Right Left      Knee    Flexion (135) WNL 57  Extension (0) WNL -8      Ankle    Dorsiflexion (20)  WNL  Plantarflexion (50)  WNL  Inversion (35)    Eversion (15    (* = pain; Blank rows = not tested)   L knee PROM: -5-78   LE MMT: MMT (out of 5) Right Left  Hip flexion 4 3+  Hip extension    Hip abduction 4+ 4+  Hip adduction 4+ 4+  Hip internal rotation    Hip external rotation  Knee flexion 4+ 4-  Knee extension 4+ 4  Ankle dorsiflexion    Ankle plantarflexion    Ankle inversion    Ankle eversion    (* = pain; Blank rows = not tested)  Sensation Deferred  Reflexes Deferred    Palpation Location LEFT  RIGHT           Quadriceps 2 (VMO) 0  Medial Hamstrings 0 0  Lateral Hamstrings 0 0  Lateral Hamstring tendon    Medial Hamstring tendon    Quadriceps tendon 1   Patella 0   Patellar Tendon 1   Tibial Tuberosity    Medial joint line 2   Lateral joint line 1   MCL    LCL    Adductor Tubercle    Pes Anserine tendon    Infrapatellar fat pad    Fibular head    Popliteal fossa    (Blank rows = not tested) Graded on 0-4 scale (0 = no pain, 1 = pain, 2 = pain with wincing/grimacing/flinching, 3 = pain with withdrawal, 4 = unwilling to allow palpation), (Blank rows = not tested)    VASCULAR Dorsalis pedis and posterior tibial pulses are  palpable Pitting edema 2+ along anterior shin No significant increased temperature along calf/shin Moderate erythema of lower leg Significant swelling in L knee and ankle  *1 point per Well's criteria following subtraction for alternative diagnosis at least as likely     TODAY'S TREATMENT:    Therapeutic Exercise - for HEP establishment, discussion on appropriate exercise/activity modification, PT education   Reviewed baseline home exercises and provided handout for [employer] program (see Access Code); tactile cueing and therapist demonstration utilized as needed for carryover of proper technique to HEP.    Patient education on current condition, anatomy involved, prognosis, plan of care. Discussion on DVT prevention and handout for DVT signs/symptoms given to patient. We discussed seeking immediate medical attention is experiencing rapidly worsening calf/leg pain, color change, temperature change, or worsening girth/edema.     PATIENT EDUCATION:  Education details: see above for patient education details  Person educated: Patient and Spouse Education method: Explanation, Demonstration, and Handouts Education comprehension: verbalized understanding and returned demonstration   HOME EXERCISE PROGRAM:  Access Code: [email or web address] URL: [email or web address] Date: 08/21/2023 Prepared by: Consuela Mimes  Exercises - Supine Quad Set  - 3 x daily - 7 x weekly - 2 sets - 10 reps - 5sec hold - Supine Heel Slide with Strap  - 3 x daily - 7 x weekly - 2 sets - 10 reps - 5 sec hold (+ handout provided for DVT signs)   ASSESSMENT:  CLINICAL IMPRESSION: Patient is an 82 y.o. female who was seen today for physical therapy evaluation and treatment s/p L TKA 08/01/23. Patient has expected post-op impairments including decreased L knee ROM, decreased patellofemoral/tibiofemoral joint mobility, post-op swelling, decreased LE strength, gait changes, and post-operative pain. Pt has  associated activity limitations in community-level ambulation, transfers, stair/obstacle negotiation. Pt does have notable leg swelling with pitting edema along anterior tibial region; pt does not have significant calf tenderness, venous distension, or color change. 1 point per Well's criteria. We discussed S&S to monitor for DVT and signs necessitating immediate medical attention. Pt will benefit from skilled PT services to address deficits for best return to PLOF.    OBJECTIVE IMPAIRMENTS: Abnormal gait, decreased activity tolerance, decreased mobility, difficulty walking, decreased ROM, decreased strength, hypomobility, increased edema, impaired flexibility, and pain.   ACTIVITY LIMITATIONS: bending, sitting, standing, squatting,  stairs, transfers, bed mobility, toileting, and locomotion level  PARTICIPATION LIMITATIONS: meal prep, cleaning, driving, shopping, and community activity  PERSONAL FACTORS: Age and 3+ comorbidities: (HTN, hx of thyroid cancer and post-operative hypothyroidism, GI dysfunction)  are also affecting patient's functional outcome.   REHAB POTENTIAL: Excellent  CLINICAL DECISION MAKING: Evolving/moderate complexity  EVALUATION COMPLEXITY: Moderate   GOALS: Goals reviewed with patient? Yes  SHORT TERM GOALS: Target date: 09/11/2023  Pt will be independent with HEP to improve strength and decrease knee pain to improve pain-free function at home and work. Baseline: 08/21/23: Baseline HEP reviewed/[employer] handout provided.  Goal status: INITIAL  Pt will improve L knee AROM to 0-100 in first 3-4 weeks of PT as needed for improved ROM needed for transferring, self-care ADLs Baseline: 08/21/23: L knee AROM -8-57 Goal status: INITIAL   LONG TERM GOALS: Target date: 10/19/2023  Pt will increase FOTO to at least 58 to demonstrate significant improvement in function at home and work related to knee pain  Baseline: 08/21/23: 37 Goal status: INITIAL  2.  Pt will  decrease worst knee pain by at least 3 points on the NPRS in order to demonstrate clinically significant reduction in knee pain. Baseline: 08/21/23: 8/10 pain at worst Goal status: INITIAL  3.  Pt will have L knee AROM 0-120 indicative of full ROM as needed for completion of LE management, self-care ADLs, and transfers from various-height surfaces.    Baseline: 08/21/23: -8-57 Goal status: INITIAL  4.  Pt will increase strength of tested lower extremity musculature to at least 4+/5 or greater MMT grade in order to demonstrate improvement in strength and function  Baseline: 08/21/23: LLE strength 3- to 4/5 with exception of modified hip ABD/ADD MMT in sitting.  Goal status: INITIAL  5.  Pt will ambulate without AD at community-level distance (660 ft or greater) without LOB, symmetrical weightbearing, and no major gait deviations  Baseline: 08/21/23: Limited capacity for community-level gait, use of FWW primarily. Goal status: INITIAL   PLAN: PT FREQUENCY: 1-2x/week  PT DURATION: 8 weeks  PLANNED INTERVENTIONS: Therapeutic exercises, Therapeutic activity, Neuromuscular re-education, Balance training, Gait training, Patient/Family education, Self Care, Joint mobilization, Joint manipulation, Vestibular training, Canalith repositioning, Orthotic/Fit training, DME instructions, Dry Needling, Electrical stimulation, Spinal manipulation, Spinal mobilization, Cryotherapy, Moist heat, Taping, Traction, Ultrasound, Ionotophoresis 4mg /ml Dexamethasone, Manual therapy, and Re-evaluation.  PLAN FOR NEXT SESSION: Progress L knee ROM as tolerated, L knee terminal extension, [place] quad isotonics. Progress with weight shifting and gradual AD wean as able.     Consuela Mimes, PT, DPT #[id number]  Gertie Exon, PT 08/22/2023, 9:39 PM

## 2023-08-22 ENCOUNTER — Encounter: Payer: Self-pay | Admitting: Physical Therapy

## 2023-08-22 NOTE — Addendum Note (Signed)
Addended by: Gertie Exon on: 08/22/2023 09:53 PM   Modules accepted: Orders

## 2023-08-23 ENCOUNTER — Encounter: Payer: Self-pay | Admitting: Physical Therapy

## 2023-08-23 ENCOUNTER — Ambulatory Visit: Payer: Medicare HMO | Attending: Orthopedic Surgery | Admitting: Physical Therapy

## 2023-08-23 DIAGNOSIS — M6281 Muscle weakness (generalized): Secondary | ICD-10-CM | POA: Insufficient documentation

## 2023-08-23 DIAGNOSIS — M25562 Pain in left knee: Secondary | ICD-10-CM | POA: Diagnosis present

## 2023-08-23 DIAGNOSIS — M25662 Stiffness of left knee, not elsewhere classified: Secondary | ICD-10-CM | POA: Insufficient documentation

## 2023-08-23 DIAGNOSIS — R262 Difficulty in walking, not elsewhere classified: Secondary | ICD-10-CM | POA: Insufficient documentation

## 2023-08-23 NOTE — Therapy (Signed)
OUTPATIENT PHYSICAL THERAPY POST-OP TREATMENT  Patient Name: Alisha Stevens MRN: [id number] DOB:January 28, 1941, 82 y.o., female Today's Date: 08/23/2023   END OF SESSION:  PT End of Session - 08/23/23 1054     Visit Number 2    Number of Visits 17    Date for PT Re-Evaluation 10/19/23    Authorization Type [employer]    PT Start Time 1029    PT Stop Time 1112    PT Time Calculation (min) 43 min    Equipment Utilized During Treatment --   Pt uses personal front-wheeled walker for mobility   Activity Tolerance Patient tolerated treatment well;Patient limited by pain    Behavior During Therapy WFL for tasks assessed/performed               Past Medical History:  Diagnosis Date   Cancer (HCC)    Thyroid   Hyperlipemia    Hypertension    Insomnia    Osteoporosis    Personal history of radiation therapy    for thyroid   Reflux    Thyroid disease    Vitamin D deficiency    Past Surgical History:  Procedure Laterality Date   ABDOMINAL HYSTERECTOMY     BREAST SURGERY     BUNIONECTOMY Right    COLONOSCOPY     COLONOSCOPY N/A 04/16/2022   Procedure: COLONOSCOPY;  Surgeon: Jaynie Collins, DO;  Location: [facility] ENDOSCOPY;  Service: Gastroenterology;  Laterality: N/A;   COLONOSCOPY WITH PROPOFOL N/A 06/18/2020   Procedure: COLONOSCOPY WITH PROPOFOL;  Surgeon: Toledo, Boykin Nearing, MD;  Location: [facility] ENDOSCOPY;  Service: Gastroenterology;  Laterality: N/A;   ESOPHAGOGASTRODUODENOSCOPY (EGD) WITH PROPOFOL N/A 06/18/2020   Procedure: ESOPHAGOGASTRODUODENOSCOPY (EGD) WITH PROPOFOL;  Surgeon: Toledo, Boykin Nearing, MD;  Location: [facility] ENDOSCOPY;  Service: Gastroenterology;  Laterality: N/A;   ESOPHAGOGASTRODUODENOSCOPY (EGD) WITH PROPOFOL N/A 04/15/2022   Procedure: ESOPHAGOGASTRODUODENOSCOPY (EGD) WITH PROPOFOL;  Surgeon: Toney Reil, MD;  Location: [facility] ENDOSCOPY;  Service: Gastroenterology;  Laterality: N/A;   HIP ARTHROPLASTY Right    retinopexy Right     THYROIDECTOMY     TONSILLECTOMY     Patient Active Problem List   Diagnosis Date Noted   Diverticulosis of large intestine with hemorrhage    Gastroesophageal reflux disease without esophagitis 04/15/2022   Other specified hypothyroidism    Rectal bleeding 04/14/2022   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   Borderline high serum cholesterol 06/24/2015   BP (high blood pressure) 06/24/2015   Cannot sleep 06/24/2015   Carcinoma of thyroid (HCC) 06/24/2015   Reflux 06/24/2015   Osteoporosis 06/24/2015   Arthritis, degenerative 06/24/2015   Abdominal pain 05/01/2014   Constipation 05/01/2014   Diarrhea 05/01/2014   Foreign body of rectum 05/01/2014   Avitaminosis D 10/24/2012   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 Unknown Foley, MD  FOLLOW-UP APPT SCHEDULED WITH REFERRING PROVIDER: Yes ; f/u with surgeon 09/13/23  PERTINENT HISTORY: Pt is an 82 year old female s/p L TKA 08/01/23. Patient reports lot of swelling. Patient reports no significant wound care issues. Pt reports no home health PT since surgery. Patient reports  hx of diabetic neuropathy with some numbness in foot/toes. Pt has some numbness around incision. Past Med hx of HTN, osteoporosis (lower bone density in lumbar spine mainly per pt), OA, post-operative hypothyroidism. Pt reports pain is fairly well controlled with Tramadol; 2x/day and pt is also using Tylenol. Pt reports mild pain at rest. Pt reports doing well with car transfers, getting in/out of house, getting around house. Hx of R  THA.  PAIN:   Pain Intensity: Present: 3-4/10, Best: 1/10, Worst: 8/10 Pain location: medial knee complex, along "path of incision" Pain quality:  burning, throbbing   Swelling: Yes , L knee and leg Numbness/Tingling: Yes; some numbness next to incision, neuropathy in feet  Focal weakness or buckling: No, pt able to ambulate with FWW  Aggravating factors: riding in car, bending knee, transfers e.g. up/down from chair  Relieving factors: Tramadol, Tylenol, cold pack/ice machine   History of prior back, hip, or knee injury, pain, surgery, or therapy: No  Imaging: Yes ; good post-op radiographs   Prior level of function: Independent with community mobility with device Occupational demands: Retired  Presenter, broadcasting: Back to walking exercise program, DIY projects around house  Red flags: Negative for personal history of cancer, chills/fever, night sweats, nausea, vomiting, unexplained weight gain/loss, unrelenting pain  PRECAUTIONS: None  WEIGHT BEARING RESTRICTIONS:  WBAT  FALLS: Has patient fallen in last 6 months? No  Living Environment Lives with: lives with their spouse and her son  Lives in: House/apartment Built in seat shower, safety strips on shower floor, suction grab bar; raised commode. Only small threshold into home from garage. They have upstairs, but everything she uses is on first level.    Patient Goals: return to normal walking, walking routine (up to 1 mi previously, 2 years ago); able to return to exercise classes (e.g. chair aerobics); able to travel (further in car)   OBJECTIVE:   GAIT: Comments: With FWW, Dec L knee terminal knee extension at terminal swing, good heel to toe progression and normal step length, moderate forward lean onto front-wheeled walker, mild dec stance time LLE   AROM AROM (Normal range in degrees) AROM  Hip Right Left      Knee    Flexion (135) WNL 57  Extension (0) WNL -8      Ankle    Dorsiflexion (20)  WNL  Plantarflexion (50)   WNL  Inversion (35)    Eversion (15    (* = pain; Blank rows = not tested)   L knee PROM: -5-78   LE MMT: MMT (out of 5) Right Left  Hip flexion 4 3+  Hip extension    Hip abduction 4+ 4+  Hip adduction 4+ 4+  Hip internal rotation    Hip external rotation    Knee flexion 4+ 4-  Knee extension 4+ 4  Ankle dorsiflexion    Ankle plantarflexion    Ankle inversion    Ankle eversion    (* = pain; Blank rows = not tested)   Palpation Location LEFT  RIGHT           Quadriceps 2 (VMO) 0  Medial Hamstrings 0 0  Lateral Hamstrings 0 0  Lateral Hamstring tendon    Medial Hamstring tendon    Quadriceps tendon 1   Patella 0   Patellar Tendon 1   Tibial Tuberosity    Medial joint line 2   Lateral joint line 1   MCL    LCL    Adductor Tubercle  Pes Anserine tendon    Infrapatellar fat pad    Fibular head    Popliteal fossa    (Blank rows = not tested) Graded on 0-4 scale (0 = no pain, 1 = pain, 2 = pain with wincing/grimacing/flinching, 3 = pain with withdrawal, 4 = unwilling to allow palpation), (Blank rows = not tested)     TODAY'S TREATMENT:   SUBJECTIVE STATEMENT:   Patient reports feeling relatively comfortable with minimal pain at arrival. She is continuing to use Rx Tramadol and Tylenol for pain control with use of machine at home for icing. Pt feels that swelling has modestly improved. She is compliant with home exercises including those already given prior to PT.    Manual Therapy - for symptom modulation, soft tissue sensitivity and mobility, joint mobility, ROM   L knee PROM within patient tolerance in supine and sitting (for flexion and extension) x 10 Passive stretch into flexion over edge of table, seated; 3 x 30 sec  Patellar mobilization, gr III; emphasis on superior and inferior glides; 3 x 30 sec bouts     Therapeutic Exercise - for improved soft tissue flexibility and extensibility as needed for ROM, improved strength as needed to improve  performance of CKC activities/functional movements  Quad set, small rolled up sheet behind knee for tactile input; reviewed for HEP, 1x10 Heel slide with sheet; x10, 10 sec Short arc quad; 2x10, 5 sec Long arc quad; 2x10, 5 sec  PATIENT EDUCATION: Discussed expectations for ROM progression. Reviewed and updated HEP.    *next visit* Ambulate laps with walker in gym; x 3 laps Forward and retro steps in // bars for terminal knee extension; 4x D/B    Cold pack (unbilled) - for anti-inflammatory and analgesic effect as needed for reduced pain and improved ability to participate in active PT intervention, along L knee in supine with pillow behind calf, x 5 minutes     PATIENT EDUCATION:  Education details: see above for patient education details  Person educated: Patient and Spouse Education method: Explanation, Demonstration, and Handouts Education comprehension: verbalized understanding and returned demonstration   HOME EXERCISE PROGRAM:  Access Code: [email or web address] URL: [email or web address] Date: 08/23/2023 Prepared by: Consuela Mimes  Exercises - Supine Quad Set  - 3 x daily - 7 x weekly - 2 sets - 10 reps - 5sec hold - Supine Heel Slide with Strap  - 3 x daily - 7 x weekly - 2 sets - 10 reps - 5 sec hold - Seated Long Arc Quad  - 2 x daily - 7 x weekly - 2 sets - 10 reps - 5sec hold - Seated Knee Flexion Slide  - 2 x daily - 7 x weekly - 2 sets - 10 reps - 5sec hold  (+ handout provided for DVT signs)   ASSESSMENT:  CLINICAL IMPRESSION: Patient is an 82 y.o. female s/p L TKA 08/01/23. Patient demonstrates significantly improving knee extension ROM - full knee extension is noted passively today (WFL knee extension actively). She is able to attain knee flexion up to 82 deg with stretching over edge of table in seated position. Pt tolerates addition of OKC quad isotonics well without notable pain. Patient has remaining post-op impairments in: decreased L knee ROM,  decreased patellofemoral/tibiofemoral joint mobility, post-op swelling, decreased LE strength, gait changes, and post-operative pain. Pt will benefit from skilled PT services to address deficits for best return to PLOF.    OBJECTIVE IMPAIRMENTS: Abnormal gait, decreased activity tolerance, decreased mobility, difficulty walking,  decreased ROM, decreased strength, hypomobility, increased edema, impaired flexibility, and pain.   ACTIVITY LIMITATIONS: bending, sitting, standing, squatting, stairs, transfers, bed mobility, toileting, and locomotion level  PARTICIPATION LIMITATIONS: meal prep, cleaning, driving, shopping, and community activity  PERSONAL FACTORS: Age and 3+ comorbidities: (HTN, hx of thyroid cancer and post-operative hypothyroidism, GI dysfunction)  are also affecting patient's functional outcome.   REHAB POTENTIAL: Excellent  CLINICAL DECISION MAKING: Evolving/moderate complexity  EVALUATION COMPLEXITY: Moderate   GOALS: Goals reviewed with patient? Yes  SHORT TERM GOALS: Target date: 09/11/2023  Pt will be independent with HEP to improve strength and decrease knee pain to improve pain-free function at home and work. Baseline: 08/21/23: Baseline HEP reviewed/MedBridge handout provided.  Goal status: INITIAL  Pt will improve L knee AROM to 0-100 in first 3-4 weeks of PT as needed for improved ROM needed for transferring, self-care ADLs Baseline: 08/21/23: L knee AROM -8-57 Goal status: INITIAL   LONG TERM GOALS: Target date: 10/19/2023  Pt will increase FOTO to at least 58 to demonstrate significant improvement in function at home and work related to knee pain  Baseline: 08/21/23: 37 Goal status: INITIAL  2.  Pt will decrease worst knee pain by at least 3 points on the NPRS in order to demonstrate clinically significant reduction in knee pain. Baseline: 08/21/23: 8/10 pain at worst Goal status: INITIAL  3.  Pt will have L knee AROM 0-120 indicative of full ROM as  needed for completion of LE management, self-care ADLs, and transfers from various-height surfaces.    Baseline: 08/21/23: -8-57 Goal status: INITIAL  4.  Pt will increase strength of tested lower extremity musculature to at least 4+/5 or greater MMT grade in order to demonstrate improvement in strength and function  Baseline: 08/21/23: LLE strength 3- to 4/5 with exception of modified hip ABD/ADD MMT in sitting.  Goal status: INITIAL  5.  Pt will ambulate without AD at community-level distance (660 ft or greater) without LOB, symmetrical weightbearing, and no major gait deviations  Baseline: 08/21/23: Limited capacity for community-level gait, use of FWW primarily. Goal status: INITIAL   PLAN: PT FREQUENCY: 1-2x/week  PT DURATION: 8 weeks  PLANNED INTERVENTIONS: Therapeutic exercises, Therapeutic activity, Neuromuscular re-education, Balance training, Gait training, Patient/Family education, Self Care, Joint mobilization, Joint manipulation, Vestibular training, Canalith repositioning, Orthotic/Fit training, DME instructions, Dry Needling, Electrical stimulation, Spinal manipulation, Spinal mobilization, Cryotherapy, Moist heat, Taping, Traction, Ultrasound, Ionotophoresis 4mg /ml Dexamethasone, Manual therapy, and Re-evaluation.  PLAN FOR NEXT SESSION: Progress L knee ROM as tolerated, L knee terminal extension, OKC quad isotonics. Progress with weight shifting and gradual AD wean as able.     Consuela Mimes, PT, DPT #[id number]  Gertie Exon, PT 08/23/2023, 10:54 AM

## 2023-08-28 ENCOUNTER — Ambulatory Visit: Payer: Medicare HMO | Admitting: Physical Therapy

## 2023-08-28 NOTE — Therapy (Deleted)
OUTPATIENT PHYSICAL THERAPY POST-OP TREATMENT  Patient Name: Alisha Stevens MRN: [id number] DOB:Apr 19, 1941, 82 y.o., female Today's Date: 08/28/2023   END OF SESSION:      Past Medical History:  Diagnosis Date   Cancer [facility])    Thyroid   Hyperlipemia    Hypertension    Insomnia    Osteoporosis    Personal history of radiation therapy    for thyroid   Reflux    Thyroid disease    Vitamin D deficiency    Past Surgical History:  Procedure Laterality Date   ABDOMINAL HYSTERECTOMY     BREAST SURGERY     BUNIONECTOMY Right    COLONOSCOPY     COLONOSCOPY N/A 04/16/2022   Procedure: COLONOSCOPY;  Surgeon: Jaynie Collins, DO;  Location: [facility] ENDOSCOPY;  Service: Gastroenterology;  Laterality: N/A;   COLONOSCOPY WITH PROPOFOL N/A 06/18/2020   Procedure: COLONOSCOPY WITH PROPOFOL;  Surgeon: Toledo, Boykin Nearing, MD;  Location: [facility] ENDOSCOPY;  Service: Gastroenterology;  Laterality: N/A;   ESOPHAGOGASTRODUODENOSCOPY (EGD) WITH PROPOFOL N/A 06/18/2020   Procedure: ESOPHAGOGASTRODUODENOSCOPY (EGD) WITH PROPOFOL;  Surgeon: Toledo, Boykin Nearing, MD;  Location: [facility] ENDOSCOPY;  Service: Gastroenterology;  Laterality: N/A;   ESOPHAGOGASTRODUODENOSCOPY (EGD) WITH PROPOFOL N/A 04/15/2022   Procedure: ESOPHAGOGASTRODUODENOSCOPY (EGD) WITH PROPOFOL;  Surgeon: Toney Reil, MD;  Location: [facility] ENDOSCOPY;  Service: Gastroenterology;  Laterality: N/A;   HIP ARTHROPLASTY Right    retinopexy Right    THYROIDECTOMY     TONSILLECTOMY     Patient Active Problem List   Diagnosis Date Noted   Diverticulosis of large intestine with hemorrhage    Gastroesophageal reflux disease without esophagitis 04/15/2022   Other specified hypothyroidism    Rectal bleeding 04/14/2022   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   Borderline high serum cholesterol 06/24/2015   BP (high blood pressure) 06/24/2015   Cannot sleep 06/24/2015   Carcinoma of thyroid (HCC) 06/24/2015   Reflux  06/24/2015   Osteoporosis 06/24/2015   Arthritis, degenerative 06/24/2015   Abdominal pain 05/01/2014   Constipation 05/01/2014   Diarrhea 05/01/2014   Foreign body of rectum 05/01/2014   Avitaminosis D 10/24/2012   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 Unknown Foley, MD  FOLLOW-UP APPT SCHEDULED WITH REFERRING PROVIDER: Yes ; f/u with surgeon 09/13/23  PERTINENT HISTORY: Pt is an 82 year old female s/p L TKA 08/01/23. Patient reports lot of swelling. Patient reports no significant wound care issues. Pt reports no home health PT since surgery. Patient reports hx of diabetic neuropathy with some numbness in foot/toes. Pt has some numbness around incision. Past Med hx of HTN, osteoporosis (lower bone density in lumbar spine mainly per pt), OA, post-operative hypothyroidism. Pt reports pain is fairly well controlled with Tramadol; 2x/day and pt is also using Tylenol. Pt reports mild pain at rest. Pt reports doing well with car transfers, getting in/out of house, getting around house. Hx of R THA.  PAIN:   Pain Intensity: Present: 3-4/10, Best: 1/10, Worst: 8/10 Pain location: medial knee complex, along "path of incision" Pain quality:  burning, throbbing   Swelling: Yes , L knee and  leg Numbness/Tingling: Yes; some numbness next to incision, neuropathy in feet  Focal weakness or buckling: No, pt able to ambulate with FWW  Aggravating factors: riding in car, bending knee, transfers e.g. up/down from chair  Relieving factors: Tramadol, Tylenol, cold pack/ice machine    History of prior back, hip, or knee injury, pain, surgery, or therapy: No  Imaging: Yes ; good post-op radiographs   Prior level of function: Independent with community mobility with device Occupational demands: Retired  Presenter, broadcasting: Back to walking exercise program, DIY projects around house  Red flags: Negative for personal history of cancer, chills/fever, night sweats, nausea, vomiting, unexplained weight gain/loss, unrelenting pain  PRECAUTIONS: None  WEIGHT BEARING RESTRICTIONS:  WBAT  FALLS: Has patient fallen in last 6 months? No  Living Environment Lives with: lives with their spouse and her son  Lives in: House/apartment Built in seat shower, safety strips on shower floor, suction grab bar; raised commode. Only small threshold into home from garage. They have upstairs, but everything she uses is on first level.    Patient Goals: return to normal walking, walking routine (up to 1 mi previously, 2 years ago); able to return to exercise classes (e.g. chair aerobics); able to travel (further in car)   OBJECTIVE:   GAIT: Comments: With FWW, Dec L knee terminal knee extension at terminal swing, good heel to toe progression and normal step length, moderate forward lean onto front-wheeled walker, mild dec stance time LLE   AROM AROM (Normal range in degrees) AROM  Hip Right Left      Knee    Flexion (135) WNL 57  Extension (0) WNL -8      Ankle    Dorsiflexion (20)  WNL  Plantarflexion (50)  WNL  Inversion (35)    Eversion (15    (* = pain; Blank rows = not tested)   L knee PROM: -5-78   LE MMT: MMT (out of 5) Right Left  Hip flexion 4 3+  Hip extension    Hip abduction 4+ 4+  Hip adduction 4+ 4+  Hip internal rotation    Hip external rotation    Knee flexion 4+ 4-  Knee extension 4+ 4  Ankle dorsiflexion    Ankle plantarflexion    Ankle inversion    Ankle eversion    (* = pain; Blank rows = not tested)   Palpation Location LEFT  RIGHT            Quadriceps 2 (VMO) 0  Medial Hamstrings 0 0  Lateral Hamstrings 0 0  Lateral Hamstring tendon    Medial Hamstring tendon    Quadriceps tendon 1   Patella 0   Patellar Tendon 1   Tibial Tuberosity    Medial joint line 2   Lateral joint line 1   MCL    LCL    Adductor Tubercle    Pes Anserine tendon    Infrapatellar fat pad    Fibular head    Popliteal fossa    (Blank rows = not tested) Graded on 0-4 scale (0 = no pain, 1 = pain, 2 = pain with wincing/grimacing/flinching, 3 = pain with withdrawal, 4 = unwilling to allow palpation), (Blank rows = not tested)     TODAY'S TREATMENT:   SUBJECTIVE STATEMENT:   Patient reports feeling relatively comfortable with minimal pain at arrival. She is continuing to use Rx Tramadol and Tylenol for pain control with use of machine at home for icing. Pt feels that swelling  has modestly improved. She is compliant with home exercises including those already given prior to PT.    Manual Therapy - for symptom modulation, soft tissue sensitivity and mobility, joint mobility, ROM   L knee PROM within patient tolerance in supine and sitting (for flexion and extension) x 10 Passive stretch into flexion over edge of table, seated; 3 x 30 sec  Patellar mobilization, gr III; emphasis on superior and inferior glides; 3 x 30 sec bouts     Therapeutic Exercise - for improved soft tissue flexibility and extensibility as needed for ROM, improved strength as needed to improve performance of CKC activities/functional movements  Quad set, small rolled up sheet behind knee for tactile input; reviewed for HEP, 1x10 Heel slide with sheet; x10, 10 sec Short arc quad; 2x10, 5 sec Long arc quad; 2x10, 5 sec  PATIENT EDUCATION: Discussed expectations for ROM progression. Reviewed and updated HEP.    *next visit* Ambulate laps with walker in gym; x 3 laps Forward and retro steps in // bars for terminal knee extension; 4x D/B    Cold pack (unbilled) -  for anti-inflammatory and analgesic effect as needed for reduced pain and improved ability to participate in active PT intervention, along L knee in supine with pillow behind calf, x 5 minutes     PATIENT EDUCATION:  Education details: see above for patient education details  Person educated: Patient and Spouse Education method: Explanation, Demonstration, and Handouts Education comprehension: verbalized understanding and returned demonstration   HOME EXERCISE PROGRAM:  Access Code: [email or web address] URL: [email or web address] Date: 08/23/2023 Prepared by: Consuela Mimes  Exercises - Supine Quad Set  - 3 x daily - 7 x weekly - 2 sets - 10 reps - 5sec hold - Supine Heel Slide with Strap  - 3 x daily - 7 x weekly - 2 sets - 10 reps - 5 sec hold - Seated Long Arc Quad  - 2 x daily - 7 x weekly - 2 sets - 10 reps - 5sec hold - Seated Knee Flexion Slide  - 2 x daily - 7 x weekly - 2 sets - 10 reps - 5sec hold  (+ handout provided for DVT signs)   ASSESSMENT:  CLINICAL IMPRESSION: Patient is an 82 y.o. female s/p L TKA 08/01/23. Patient demonstrates significantly improving knee extension ROM - full knee extension is noted passively today (WFL knee extension actively). She is able to attain knee flexion up to 82 deg with stretching over edge of table in seated position. Pt tolerates addition of OKC quad isotonics well without notable pain. Patient has remaining post-op impairments in: decreased L knee ROM, decreased patellofemoral/tibiofemoral joint mobility, post-op swelling, decreased LE strength, gait changes, and post-operative pain. Pt will benefit from skilled PT services to address deficits for best return to PLOF.    OBJECTIVE IMPAIRMENTS: Abnormal gait, decreased activity tolerance, decreased mobility, difficulty walking, decreased ROM, decreased strength, hypomobility, increased edema, impaired flexibility, and pain.   ACTIVITY LIMITATIONS: bending, sitting, standing,  squatting, stairs, transfers, bed mobility, toileting, and locomotion level  PARTICIPATION LIMITATIONS: meal prep, cleaning, driving, shopping, and community activity  PERSONAL FACTORS: Age and 3+ comorbidities: (HTN, hx of thyroid cancer and post-operative hypothyroidism, GI dysfunction)  are also affecting patient's functional outcome.   REHAB POTENTIAL: Excellent  CLINICAL DECISION MAKING: Evolving/moderate complexity  EVALUATION COMPLEXITY: Moderate   GOALS: Goals reviewed with patient? Yes  SHORT TERM GOALS: Target date: 09/11/2023  Pt will be independent with HEP to improve strength and decrease knee  pain to improve pain-free function at home and work. Baseline: 08/21/23: Baseline HEP reviewed/MedBridge handout provided.  Goal status: INITIAL  Pt will improve L knee AROM to 0-100 in first 3-4 weeks of PT as needed for improved ROM needed for transferring, self-care ADLs Baseline: 08/21/23: L knee AROM -8-57 Goal status: INITIAL   LONG TERM GOALS: Target date: 10/19/2023  Pt will increase FOTO to at least 58 to demonstrate significant improvement in function at home and work related to knee pain  Baseline: 08/21/23: 37 Goal status: INITIAL  2.  Pt will decrease worst knee pain by at least 3 points on the NPRS in order to demonstrate clinically significant reduction in knee pain. Baseline: 08/21/23: 8/10 pain at worst Goal status: INITIAL  3.  Pt will have L knee AROM 0-120 indicative of full ROM as needed for completion of LE management, self-care ADLs, and transfers from various-height surfaces.    Baseline: 08/21/23: -8-57 Goal status: INITIAL  4.  Pt will increase strength of tested lower extremity musculature to at least 4+/5 or greater MMT grade in order to demonstrate improvement in strength and function  Baseline: 08/21/23: LLE strength 3- to 4/5 with exception of modified hip ABD/ADD MMT in sitting.  Goal status: INITIAL  5.  Pt will ambulate without AD at  community-level distance (660 ft or greater) without LOB, symmetrical weightbearing, and no major gait deviations  Baseline: 08/21/23: Limited capacity for community-level gait, use of FWW primarily. Goal status: INITIAL   PLAN: PT FREQUENCY: 1-2x/week  PT DURATION: 8 weeks  PLANNED INTERVENTIONS: Therapeutic exercises, Therapeutic activity, Neuromuscular re-education, Balance training, Gait training, Patient/Family education, Self Care, Joint mobilization, Joint manipulation, Vestibular training, Canalith repositioning, Orthotic/Fit training, DME instructions, Dry Needling, Electrical stimulation, Spinal manipulation, Spinal mobilization, Cryotherapy, Moist heat, Taping, Traction, Ultrasound, Ionotophoresis 4mg /ml Dexamethasone, Manual therapy, and Re-evaluation.  PLAN FOR NEXT SESSION: Progress L knee ROM as tolerated, L knee terminal extension, OKC quad isotonics. Progress with weight shifting and gradual AD wean as able.     Consuela Mimes, PT, DPT #[id number]  Gertie Exon, PT 08/28/2023, 4:41 AM

## 2023-08-30 ENCOUNTER — Ambulatory Visit: Payer: Medicare HMO | Admitting: Physical Therapy

## 2023-08-30 ENCOUNTER — Encounter: Payer: Self-pay | Admitting: Physical Therapy

## 2023-08-30 DIAGNOSIS — M25562 Pain in left knee: Secondary | ICD-10-CM | POA: Diagnosis not present

## 2023-08-30 DIAGNOSIS — M6281 Muscle weakness (generalized): Secondary | ICD-10-CM

## 2023-08-30 DIAGNOSIS — M25662 Stiffness of left knee, not elsewhere classified: Secondary | ICD-10-CM

## 2023-08-30 DIAGNOSIS — R262 Difficulty in walking, not elsewhere classified: Secondary | ICD-10-CM

## 2023-08-30 NOTE — Therapy (Signed)
OUTPATIENT PHYSICAL THERAPY POST-OP TREATMENT  Patient Name: Alisha Stevens MRN: [id number] DOB:09-14-1941, 82 y.o., female Today's Date: 08/30/2023   END OF SESSION:  PT End of Session - 08/30/23 0955     Visit Number 3    Number of Visits 17    Date for PT Re-Evaluation 10/19/23    Authorization Type [employer]    PT Start Time [phone]    PT Stop Time 1032    PT Time Calculation (min) 39 min    Equipment Utilized During Treatment --   Pt uses personal front-wheeled walker for mobility   Activity Tolerance Patient tolerated treatment well;Patient limited by pain    Behavior During Therapy WFL for tasks assessed/performed              Past Medical History:  Diagnosis Date   Cancer (HCC)    Thyroid   Hyperlipemia    Hypertension    Insomnia    Osteoporosis    Personal history of radiation therapy    for thyroid   Reflux    Thyroid disease    Vitamin D deficiency    Past Surgical History:  Procedure Laterality Date   ABDOMINAL HYSTERECTOMY     BREAST SURGERY     BUNIONECTOMY Right    COLONOSCOPY     COLONOSCOPY N/A 04/16/2022   Procedure: COLONOSCOPY;  Surgeon: Jaynie Collins, DO;  Location: [facility] ENDOSCOPY;  Service: Gastroenterology;  Laterality: N/A;   COLONOSCOPY WITH PROPOFOL N/A 06/18/2020   Procedure: COLONOSCOPY WITH PROPOFOL;  Surgeon: Toledo, Boykin Nearing, MD;  Location: [facility] ENDOSCOPY;  Service: Gastroenterology;  Laterality: N/A;   ESOPHAGOGASTRODUODENOSCOPY (EGD) WITH PROPOFOL N/A 06/18/2020   Procedure: ESOPHAGOGASTRODUODENOSCOPY (EGD) WITH PROPOFOL;  Surgeon: Toledo, Boykin Nearing, MD;  Location: [facility] ENDOSCOPY;  Service: Gastroenterology;  Laterality: N/A;   ESOPHAGOGASTRODUODENOSCOPY (EGD) WITH PROPOFOL N/A 04/15/2022   Procedure: ESOPHAGOGASTRODUODENOSCOPY (EGD) WITH PROPOFOL;  Surgeon: Toney Reil, MD;  Location: [facility] ENDOSCOPY;  Service: Gastroenterology;  Laterality: N/A;   HIP ARTHROPLASTY Right    retinopexy Right     THYROIDECTOMY     TONSILLECTOMY     Patient Active Problem List   Diagnosis Date Noted   Diverticulosis of large intestine with hemorrhage    Gastroesophageal reflux disease without esophagitis 04/15/2022   Other specified hypothyroidism    Rectal bleeding 04/14/2022   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   Borderline high serum cholesterol 06/24/2015   BP (high blood pressure) 06/24/2015   Cannot sleep 06/24/2015   Carcinoma of thyroid (HCC) 06/24/2015   Reflux 06/24/2015   Osteoporosis 06/24/2015   Arthritis, degenerative 06/24/2015   Abdominal pain 05/01/2014   Constipation 05/01/2014   Diarrhea 05/01/2014   Foreign body of rectum 05/01/2014   Avitaminosis D 10/24/2012   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 Unknown Foley, MD  FOLLOW-UP APPT SCHEDULED WITH REFERRING PROVIDER: Yes ; f/u with surgeon 09/13/23  PERTINENT HISTORY: Pt is an 82 year old female s/p L TKA 08/01/23. Patient reports lot of swelling. Patient reports no significant wound care issues. Pt reports no home health PT since surgery. Patient reports hx  of diabetic neuropathy with some numbness in foot/toes. Pt has some numbness around incision. Past Med hx of HTN, osteoporosis (lower bone density in lumbar spine mainly per pt), OA, post-operative hypothyroidism. Pt reports pain is fairly well controlled with Tramadol; 2x/day and pt is also using Tylenol. Pt reports mild pain at rest. Pt reports doing well with car transfers, getting in/out of house, getting around house. Hx of R  THA.  PAIN:   Pain Intensity: Present: 3-4/10, Best: 1/10, Worst: 8/10 Pain location: medial knee complex, along "path of incision" Pain quality:  burning, throbbing   Swelling: Yes , L knee and leg Numbness/Tingling: Yes; some numbness next to incision, neuropathy in feet  Focal weakness or buckling: No, pt able to ambulate with FWW  Aggravating factors: riding in car, bending knee, transfers e.g. up/down from chair  Relieving factors: Tramadol, Tylenol, cold pack/ice machine   History of prior back, hip, or knee injury, pain, surgery, or therapy: No  Imaging: Yes ; good post-op radiographs   Prior level of function: Independent with community mobility with device Occupational demands: Retired  Presenter, broadcasting: Back to walking exercise program, DIY projects around house  Red flags: Negative for personal history of cancer, chills/fever, night sweats, nausea, vomiting, unexplained weight gain/loss, unrelenting pain  PRECAUTIONS: None  WEIGHT BEARING RESTRICTIONS:  WBAT  FALLS: Has patient fallen in last 6 months? No  Living Environment Lives with: lives with their spouse and her son  Lives in: House/apartment Built in seat shower, safety strips on shower floor, suction grab bar; raised commode. Only small threshold into home from garage. They have upstairs, but everything she uses is on first level.    Patient Goals: return to normal walking, walking routine (up to 1 mi previously, 2 years ago); able to return to exercise classes (e.g. chair aerobics); able to travel (further in car)   OBJECTIVE:   GAIT: Comments: With FWW, Dec L knee terminal knee extension at terminal swing, good heel to toe progression and normal step length, moderate forward lean onto front-wheeled walker, mild dec stance time LLE   AROM AROM (Normal range in degrees) AROM  Hip Right Left      Knee    Flexion (135) WNL 57  Extension (0) WNL -8      Ankle    Dorsiflexion (20)  WNL  Plantarflexion (50)   WNL  Inversion (35)    Eversion (15    (* = pain; Blank rows = not tested)   L knee PROM: -5-78   LE MMT: MMT (out of 5) Right Left  Hip flexion 4 3+  Hip extension    Hip abduction 4+ 4+  Hip adduction 4+ 4+  Hip internal rotation    Hip external rotation    Knee flexion 4+ 4-  Knee extension 4+ 4  Ankle dorsiflexion    Ankle plantarflexion    Ankle inversion    Ankle eversion    (* = pain; Blank rows = not tested)   Palpation Location LEFT  RIGHT           Quadriceps 2 (VMO) 0  Medial Hamstrings 0 0  Lateral Hamstrings 0 0  Lateral Hamstring tendon    Medial Hamstring tendon    Quadriceps tendon 1   Patella 0   Patellar Tendon 1   Tibial Tuberosity    Medial joint line 2   Lateral joint line 1   MCL    LCL    Adductor Tubercle  Pes Anserine tendon    Infrapatellar fat pad    Fibular head    Popliteal fossa    (Blank rows = not tested) Graded on 0-4 scale (0 = no pain, 1 = pain, 2 = pain with wincing/grimacing/flinching, 3 = pain with withdrawal, 4 = unwilling to allow palpation), (Blank rows = not tested)     TODAY'S TREATMENT:   SUBJECTIVE STATEMENT:   Patient reports running out of Rx pain medication. Patient reports some pain after prolonged standing in kitchen the night before. Patient reports 5/10 pain at arrival.     Manual Therapy - for symptom modulation, soft tissue sensitivity and mobility, joint mobility, ROM   L knee PROM within patient tolerance in supine and sitting (for flexion and extension) x 10 Passive stretch into flexion over edge of table, seated; 3 x 30 sec  Patellar mobilization, gr III; emphasis on superior and inferior glides; 3 x 30 sec bouts     Therapeutic Exercise - for improved soft tissue flexibility and extensibility as needed for ROM, improved strength as needed to improve performance of CKC activities/functional movements  Heel slide with sheet; x10, 5 sec hold  Seated heel slide, opposite LE assisted, x 5,  10 sec  Short arc quad; 2x10, 5 sec Long arc quad; 2x10, 5 sec  PATIENT EDUCATION: Reviewed expectations for ROM progression.    *not today* Quad set, small rolled up sheet behind knee for tactile input; reviewed for HEP, 1x10   *next visit* Ambulate laps with walker in gym; x 3 laps Forward and retro steps in // bars for terminal knee extension; 4x D/B    Cold pack (unbilled) - for anti-inflammatory and analgesic effect as needed for reduced pain and improved ability to participate in active PT intervention, along L knee in supine with pillow behind calf, x 5 minutes     PATIENT EDUCATION:  Education details: see above for patient education details  Person educated: Patient and Spouse Education method: Explanation, Demonstration, and Handouts Education comprehension: verbalized understanding and returned demonstration   HOME EXERCISE PROGRAM:  Access Code: [email or web address] URL: [email or web address] Date: 08/23/2023 Prepared by: Consuela Mimes  Exercises - Supine Quad Set  - 3 x daily - 7 x weekly - 2 sets - 10 reps - 5sec hold - Supine Heel Slide with Strap  - 3 x daily - 7 x weekly - 2 sets - 10 reps - 5 sec hold - Seated Long Arc Quad  - 2 x daily - 7 x weekly - 2 sets - 10 reps - 5sec hold - Seated Knee Flexion Slide  - 2 x daily - 7 x weekly - 2 sets - 10 reps - 5sec hold  (+ handout provided for DVT signs)   ASSESSMENT:  CLINICAL IMPRESSION: Patient is an 82 y.o. female s/p L TKA 08/01/23. Patient has normalizing knee extension; flexion is still very limited with pt having stiff end-feel at 80-81 deg knee flexion. Up to 81 deg obtained with seated knee flexion over edge of table. Pt is accessing home and clinic well with FWW. We can work toward weaning down from AD following practicing ambulation with LRAD and less UE support in clinic. Patient has remaining post-op impairments in: decreased L knee ROM, decreased patellofemoral/tibiofemoral joint mobility,  post-op swelling, decreased LE strength, gait changes, and post-operative pain. Pt will benefit from skilled PT services to address deficits for best return to PLOF.    OBJECTIVE IMPAIRMENTS: Abnormal gait, decreased activity tolerance, decreased mobility, difficulty  walking, decreased ROM, decreased strength, hypomobility, increased edema, impaired flexibility, and pain.   ACTIVITY LIMITATIONS: bending, sitting, standing, squatting, stairs, transfers, bed mobility, toileting, and locomotion level  PARTICIPATION LIMITATIONS: meal prep, cleaning, driving, shopping, and community activity  PERSONAL FACTORS: Age and 3+ comorbidities: (HTN, hx of thyroid cancer and post-operative hypothyroidism, GI dysfunction)  are also affecting patient's functional outcome.   REHAB POTENTIAL: Excellent  CLINICAL DECISION MAKING: Evolving/moderate complexity  EVALUATION COMPLEXITY: Moderate   GOALS: Goals reviewed with patient? Yes  SHORT TERM GOALS: Target date: 09/11/2023  Pt will be independent with HEP to improve strength and decrease knee pain to improve pain-free function at home and work. Baseline: 08/21/23: Baseline HEP reviewed/Med[employer] handout provided.  Goal status: INITIAL  Pt will improve L knee AROM to 0-100 in first 3-4 weeks of PT as needed for improved ROM needed for transferring, self-care ADLs Baseline: 08/21/23: L knee AROM -8-57 Goal status: INITIAL   LONG TERM GOALS: Target date: 10/19/2023  Pt will increase FOTO to at least 58 to demonstrate significant improvement in function at home and work related to knee pain  Baseline: 08/21/23: 37 Goal status: INITIAL  2.  Pt will decrease worst knee pain by at least 3 points on the NPRS in order to demonstrate clinically significant reduction in knee pain. Baseline: 08/21/23: 8/10 pain at worst Goal status: INITIAL  3.  Pt will have L knee AROM 0-120 indicative of full ROM as needed for completion of LE management, self-care ADLs,  and transfers from various-height surfaces.    Baseline: 08/21/23: -8-57 Goal status: INITIAL  4.  Pt will increase strength of tested lower extremity musculature to at least 4+/5 or greater MMT grade in order to demonstrate improvement in strength and function  Baseline: 08/21/23: LLE strength 3- to 4/5 with exception of modified hip ABD/ADD MMT in sitting.  Goal status: INITIAL  5.  Pt will ambulate without AD at community-level distance (660 ft or greater) without LOB, symmetrical weightbearing, and no major gait deviations  Baseline: 08/21/23: Limited capacity for community-level gait, use of FWW primarily. Goal status: INITIAL   PLAN: PT FREQUENCY: 1-2x/week  PT DURATION: 8 weeks  PLANNED INTERVENTIONS: Therapeutic exercises, Therapeutic activity, Neuromuscular re-education, Balance training, Gait training, Patient/Family education, Self Care, Joint mobilization, Joint manipulation, Vestibular training, Canalith repositioning, Orthotic/Fit training, DME instructions, Dry Needling, Electrical stimulation, Spinal manipulation, Spinal mobilization, Cryotherapy, Moist heat, Taping, Traction, Ultrasound, Ionotophoresis 4mg /ml Dexamethasone, Manual therapy, and Re-evaluation.  PLAN FOR NEXT SESSION: Progress L knee ROM as tolerated, L knee terminal extension, OKC quad isotonics. Progress with weight shifting and gradual AD wean as able.     Consuela Mimes, PT, DPT #[id number]  Gertie Exon, PT 08/30/2023, 10:28 AM

## 2023-09-04 ENCOUNTER — Ambulatory Visit: Payer: Medicare HMO | Admitting: Physical Therapy

## 2023-09-04 ENCOUNTER — Encounter: Payer: Self-pay | Admitting: Physical Therapy

## 2023-09-04 DIAGNOSIS — M6281 Muscle weakness (generalized): Secondary | ICD-10-CM

## 2023-09-04 DIAGNOSIS — R262 Difficulty in walking, not elsewhere classified: Secondary | ICD-10-CM

## 2023-09-04 DIAGNOSIS — M25562 Pain in left knee: Secondary | ICD-10-CM

## 2023-09-04 DIAGNOSIS — M25662 Stiffness of left knee, not elsewhere classified: Secondary | ICD-10-CM

## 2023-09-04 NOTE — Therapy (Signed)
OUTPATIENT PHYSICAL THERAPY POST-OP TREATMENT  Patient Name: Alisha Stevens MRN: [id number] DOB:04/21/1941, 82 y.o., female Today's Date: 09/04/2023   END OF SESSION:  PT End of Session - 09/04/23 0955     Visit Number 4    Number of Visits 17    Date for PT Re-Evaluation 10/19/23    Authorization Type [employer]    PT Start Time [phone]    PT Stop Time 1033    PT Time Calculation (min) 40 min    Equipment Utilized During Treatment --   Pt uses personal front-wheeled walker for mobility   Activity Tolerance Patient tolerated treatment well;Patient limited by pain    Behavior During Therapy WFL for tasks assessed/performed               Past Medical History:  Diagnosis Date   Cancer (HCC)    Thyroid   Hyperlipemia    Hypertension    Insomnia    Osteoporosis    Personal history of radiation therapy    for thyroid   Reflux    Thyroid disease    Vitamin D deficiency    Past Surgical History:  Procedure Laterality Date   ABDOMINAL HYSTERECTOMY     BREAST SURGERY     BUNIONECTOMY Right    COLONOSCOPY     COLONOSCOPY N/A 04/16/2022   Procedure: COLONOSCOPY;  Surgeon: Jaynie Collins, DO;  Location: [facility] ENDOSCOPY;  Service: Gastroenterology;  Laterality: N/A;   COLONOSCOPY WITH PROPOFOL N/A 06/18/2020   Procedure: COLONOSCOPY WITH PROPOFOL;  Surgeon: Toledo, Boykin Nearing, MD;  Location: [facility] ENDOSCOPY;  Service: Gastroenterology;  Laterality: N/A;   ESOPHAGOGASTRODUODENOSCOPY (EGD) WITH PROPOFOL N/A 06/18/2020   Procedure: ESOPHAGOGASTRODUODENOSCOPY (EGD) WITH PROPOFOL;  Surgeon: Toledo, Boykin Nearing, MD;  Location: [facility] ENDOSCOPY;  Service: Gastroenterology;  Laterality: N/A;   ESOPHAGOGASTRODUODENOSCOPY (EGD) WITH PROPOFOL N/A 04/15/2022   Procedure: ESOPHAGOGASTRODUODENOSCOPY (EGD) WITH PROPOFOL;  Surgeon: Toney Reil, MD;  Location: [facility] ENDOSCOPY;  Service: Gastroenterology;  Laterality: N/A;   HIP ARTHROPLASTY Right    retinopexy Right     THYROIDECTOMY     TONSILLECTOMY     Patient Active Problem List   Diagnosis Date Noted   Diverticulosis of large intestine with hemorrhage    Gastroesophageal reflux disease without esophagitis 04/15/2022   Other specified hypothyroidism    Rectal bleeding 04/14/2022   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   Borderline high serum cholesterol 06/24/2015   BP (high blood pressure) 06/24/2015   Cannot sleep 06/24/2015   Carcinoma of thyroid (HCC) 06/24/2015   Reflux 06/24/2015   Osteoporosis 06/24/2015   Arthritis, degenerative 06/24/2015   Abdominal pain 05/01/2014   Constipation 05/01/2014   Diarrhea 05/01/2014   Foreign body of rectum 05/01/2014   Avitaminosis D 10/24/2012   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 Unknown Foley, MD  FOLLOW-UP APPT SCHEDULED WITH REFERRING PROVIDER: Yes ; f/u with surgeon 09/13/23  PERTINENT HISTORY: Pt is an 82 year old female s/p L TKA 08/01/23. Patient reports lot of swelling. Patient reports no significant wound care issues. Pt reports no home health PT since surgery. Patient reports  hx of diabetic neuropathy with some numbness in foot/toes. Pt has some numbness around incision. Past Med hx of HTN, osteoporosis (lower bone density in lumbar spine mainly per pt), OA, post-operative hypothyroidism. Pt reports pain is fairly well controlled with Tramadol; 2x/day and pt is also using Tylenol. Pt reports mild pain at rest. Pt reports doing well with car transfers, getting in/out of house, getting around house. Hx of R  THA.  PAIN:   Pain Intensity: Present: 3-4/10, Best: 1/10, Worst: 8/10 Pain location: medial knee complex, along "path of incision" Pain quality:  burning, throbbing   Swelling: Yes , L knee and leg Numbness/Tingling: Yes; some numbness next to incision, neuropathy in feet  Focal weakness or buckling: No, pt able to ambulate with FWW  Aggravating factors: riding in car, bending knee, transfers e.g. up/down from chair  Relieving factors: Tramadol, Tylenol, cold pack/ice machine   History of prior back, hip, or knee injury, pain, surgery, or therapy: No  Imaging: Yes ; good post-op radiographs   Prior level of function: Independent with community mobility with device Occupational demands: Retired  Presenter, broadcasting: Back to walking exercise program, DIY projects around house  Red flags: Negative for personal history of cancer, chills/fever, night sweats, nausea, vomiting, unexplained weight gain/loss, unrelenting pain  PRECAUTIONS: None  WEIGHT BEARING RESTRICTIONS:  WBAT  FALLS: Has patient fallen in last 6 months? No  Living Environment Lives with: lives with their spouse and her son  Lives in: House/apartment Built in seat shower, safety strips on shower floor, suction grab bar; raised commode. Only small threshold into home from garage. They have upstairs, but everything she uses is on first level.    Patient Goals: return to normal walking, walking routine (up to 1 mi previously, 2 years ago); able to return to exercise classes (e.g. chair aerobics); able to travel (further in car)   OBJECTIVE:   GAIT: Comments: With FWW, Dec L knee terminal knee extension at terminal swing, good heel to toe progression and normal step length, moderate forward lean onto front-wheeled walker, mild dec stance time LLE   AROM AROM (Normal range in degrees) AROM  Hip Right Left      Knee    Flexion (135) WNL 57  Extension (0) WNL -8      Ankle    Dorsiflexion (20)  WNL  Plantarflexion (50)   WNL  Inversion (35)    Eversion (15    (* = pain; Blank rows = not tested)   L knee PROM: -5-78   LE MMT: MMT (out of 5) Right Left  Hip flexion 4 3+  Hip extension    Hip abduction 4+ 4+  Hip adduction 4+ 4+  Hip internal rotation    Hip external rotation    Knee flexion 4+ 4-  Knee extension 4+ 4  Ankle dorsiflexion    Ankle plantarflexion    Ankle inversion    Ankle eversion    (* = pain; Blank rows = not tested)   Palpation Location LEFT  RIGHT           Quadriceps 2 (VMO) 0  Medial Hamstrings 0 0  Lateral Hamstrings 0 0  Lateral Hamstring tendon    Medial Hamstring tendon    Quadriceps tendon 1   Patella 0   Patellar Tendon 1   Tibial Tuberosity    Medial joint line 2   Lateral joint line 1   MCL    LCL    Adductor Tubercle  Pes Anserine tendon    Infrapatellar fat pad    Fibular head    Popliteal fossa    (Blank rows = not tested) Graded on 0-4 scale (0 = no pain, 1 = pain, 2 = pain with wincing/grimacing/flinching, 3 = pain with withdrawal, 4 = unwilling to allow palpation), (Blank rows = not tested)     TODAY'S TREATMENT:   SUBJECTIVE STATEMENT:   Patient reports pain with attempting to bend her L knee. She reports 2/10 pain at rest. Patient reports going to college sporting event and having difficulty riding in car up to 2 hours. Pt reports some challenge with walking up to sporting arena with SPC.     Manual Therapy - for symptom modulation, soft tissue sensitivity and mobility, joint mobility, ROM   L knee PROM within patient tolerance in supine and sitting (for flexion and extension) x 10 Passive stretch into flexion over edge of table, seated; 3 x 30 sec  Patellar mobilization, gr III; emphasis on superior and inferior glides; 3 x 30 sec bouts  PROM: +2-96 deg   Therapeutic Exercise - for improved soft tissue flexibility and extensibility as needed for ROM, improved strength as needed to improve performance of CKC  activities/functional movements  Heel slide with sheet; x10, 5 sec hold  Short arc quad, with large blue bolster today; 2x10, 3 sec  Long arc quad; 2x10, 5 sec  Ambulate laps with SPC RUE; x 2 laps  -Demo from PT for [facility] gait technique and task practice with pt, PT standby assist   PATIENT EDUCATION: Reviewed expectations for ROM progression and encouraged continued work on increasing flexion ROM at home as tolerated.   *next visit* NuStep; Level 3, x 5 minutes - for improved soft tissue mobility and increased tissue temperature to improve muscle performance   -subjective gathered during this time Forward and retro steps in // bars for terminal knee extension; 4x D/B   *not today* Seated heel slide, opposite LE assisted, x 5, 10 sec  Quad set, small rolled up sheet behind knee for tactile input; reviewed for HEP, 1x10    Cold pack (unbilled) - for anti-inflammatory and analgesic effect as needed for reduced pain and improved ability to participate in active PT intervention, along L knee in supine with pillow behind calf, x 5 minutes     PATIENT EDUCATION:  Education details: see above for patient education details  Person educated: Patient and Spouse Education method: Explanation, Demonstration, and Handouts Education comprehension: verbalized understanding and returned demonstration   HOME EXERCISE PROGRAM:  Access Code: [email or web address] URL: [email or web address] Date: 08/23/2023 Prepared by: Consuela Mimes  Exercises - Supine Quad Set  - 3 x daily - 7 x weekly - 2 sets - 10 reps - 5sec hold - Supine Heel Slide with Strap  - 3 x daily - 7 x weekly - 2 sets - 10 reps - 5 sec hold - Seated Long Arc Quad  - 2 x daily - 7 x weekly - 2 sets - 10 reps - 5sec hold - Seated Knee Flexion Slide  - 2 x daily - 7 x weekly - 2 sets - 10 reps - 5sec hold  (+ handout provided for DVT signs)   ASSESSMENT:  CLINICAL IMPRESSION: Patient is an 82 y.o. female s/p L TKA  08/01/23. Pt has minimal deficit with L knee extension and sound quadriceps activation. She demonstrates grossly symmetrical extension with bilateral SAQ performed in lying on table. Pt has ongoing hypomobility with patellofemoral  joint. Pt is able to obtain flexion up to 96 deg with stiff end-feel and pain at end-range. Pt needs continued work on ROM to prevent stiffening of knee replacement. Patient has remaining post-op impairments in: decreased L knee ROM, decreased patellofemoral/tibiofemoral joint mobility, post-op swelling, decreased LE strength, gait changes, and post-operative pain. Pt will benefit from skilled PT services to address deficits for best return to PLOF.    OBJECTIVE IMPAIRMENTS: Abnormal gait, decreased activity tolerance, decreased mobility, difficulty walking, decreased ROM, decreased strength, hypomobility, increased edema, impaired flexibility, and pain.   ACTIVITY LIMITATIONS: bending, sitting, standing, squatting, stairs, transfers, bed mobility, toileting, and locomotion level  PARTICIPATION LIMITATIONS: meal prep, cleaning, driving, shopping, and community activity  PERSONAL FACTORS: Age and 3+ comorbidities: (HTN, hx of thyroid cancer and post-operative hypothyroidism, GI dysfunction)  are also affecting patient's functional outcome.   REHAB POTENTIAL: Excellent  CLINICAL DECISION MAKING: Evolving/moderate complexity  EVALUATION COMPLEXITY: Moderate   GOALS: Goals reviewed with patient? Yes  SHORT TERM GOALS: Target date: 09/11/2023  Pt will be independent with HEP to improve strength and decrease knee pain to improve pain-free function at home and work. Baseline: 08/21/23: Baseline HEP reviewed/MedBridge handout provided.  Goal status: INITIAL  Pt will improve L knee AROM to 0-100 in first 3-4 weeks of PT as needed for improved ROM needed for transferring, self-care ADLs Baseline: 08/21/23: L knee AROM -8-57 Goal status: INITIAL   LONG TERM GOALS: Target  date: 10/19/2023  Pt will increase FOTO to at least 58 to demonstrate significant improvement in function at home and work related to knee pain  Baseline: 08/21/23: 37 Goal status: INITIAL  2.  Pt will decrease worst knee pain by at least 3 points on the NPRS in order to demonstrate clinically significant reduction in knee pain. Baseline: 08/21/23: 8/10 pain at worst Goal status: INITIAL  3.  Pt will have L knee AROM 0-120 indicative of full ROM as needed for completion of LE management, self-care ADLs, and transfers from various-height surfaces.    Baseline: 08/21/23: -8-57 Goal status: INITIAL  4.  Pt will increase strength of tested lower extremity musculature to at least 4+/5 or greater MMT grade in order to demonstrate improvement in strength and function  Baseline: 08/21/23: LLE strength 3- to 4/5 with exception of modified hip ABD/ADD MMT in sitting.  Goal status: INITIAL  5.  Pt will ambulate without AD at community-level distance (660 ft or greater) without LOB, symmetrical weightbearing, and no major gait deviations  Baseline: 08/21/23: Limited capacity for community-level gait, use of FWW primarily. Goal status: INITIAL   PLAN: PT FREQUENCY: 1-2x/week  PT DURATION: 8 weeks  PLANNED INTERVENTIONS: Therapeutic exercises, Therapeutic activity, Neuromuscular re-education, Balance training, Gait training, Patient/Family education, Self Care, Joint mobilization, Joint manipulation, Vestibular training, Canalith repositioning, Orthotic/Fit training, DME instructions, Dry Needling, Electrical stimulation, Spinal manipulation, Spinal mobilization, Cryotherapy, Moist heat, Taping, Traction, Ultrasound, Ionotophoresis 4mg /ml Dexamethasone, Manual therapy, and Re-evaluation.  PLAN FOR NEXT SESSION: Progress L knee ROM as tolerated, L knee terminal extension, OKC quad isotonics. Progress with weight shifting and gradual AD wean as able.     Consuela Mimes, PT, DPT #[id number]  Gertie Exon, PT 09/04/2023, 9:55 AM

## 2023-09-06 ENCOUNTER — Ambulatory Visit: Payer: Medicare HMO | Admitting: Physical Therapy

## 2023-09-06 DIAGNOSIS — M25562 Pain in left knee: Secondary | ICD-10-CM

## 2023-09-06 DIAGNOSIS — M25662 Stiffness of left knee, not elsewhere classified: Secondary | ICD-10-CM

## 2023-09-06 DIAGNOSIS — M6281 Muscle weakness (generalized): Secondary | ICD-10-CM

## 2023-09-06 DIAGNOSIS — R262 Difficulty in walking, not elsewhere classified: Secondary | ICD-10-CM

## 2023-09-06 NOTE — Therapy (Signed)
OUTPATIENT PHYSICAL THERAPY POST-OP TREATMENT  Patient Name: Alisha Stevens MRN: [id number] DOB:10/15/1941, 82 y.o., female Today's Date: 09/06/2023   END OF SESSION:  PT End of Session - 09/06/23 0949     Visit Number 5    Number of Visits 17    Date for PT Re-Evaluation 10/19/23    Authorization Type [employer]    PT Start Time [phone]    PT Stop Time 1028    PT Time Calculation (min) 40 min    Equipment Utilized During Treatment --   Pt uses personal front-wheeled walker for mobility   Activity Tolerance Patient tolerated treatment well;Patient limited by pain    Behavior During Therapy WFL for tasks assessed/performed                Past Medical History:  Diagnosis Date   Cancer (HCC)    Thyroid   Hyperlipemia    Hypertension    Insomnia    Osteoporosis    Personal history of radiation therapy    for thyroid   Reflux    Thyroid disease    Vitamin D deficiency    Past Surgical History:  Procedure Laterality Date   ABDOMINAL HYSTERECTOMY     BREAST SURGERY     BUNIONECTOMY Right    COLONOSCOPY     COLONOSCOPY N/A 04/16/2022   Procedure: COLONOSCOPY;  Surgeon: Jaynie Collins, DO;  Location: [facility] ENDOSCOPY;  Service: Gastroenterology;  Laterality: N/A;   COLONOSCOPY WITH PROPOFOL N/A 06/18/2020   Procedure: COLONOSCOPY WITH PROPOFOL;  Surgeon: Toledo, Boykin Nearing, MD;  Location: [facility] ENDOSCOPY;  Service: Gastroenterology;  Laterality: N/A;   ESOPHAGOGASTRODUODENOSCOPY (EGD) WITH PROPOFOL N/A 06/18/2020   Procedure: ESOPHAGOGASTRODUODENOSCOPY (EGD) WITH PROPOFOL;  Surgeon: Toledo, Boykin Nearing, MD;  Location: [facility] ENDOSCOPY;  Service: Gastroenterology;  Laterality: N/A;   ESOPHAGOGASTRODUODENOSCOPY (EGD) WITH PROPOFOL N/A 04/15/2022   Procedure: ESOPHAGOGASTRODUODENOSCOPY (EGD) WITH PROPOFOL;  Surgeon: Toney Reil, MD;  Location: [facility] ENDOSCOPY;  Service: Gastroenterology;  Laterality: N/A;   HIP ARTHROPLASTY Right    retinopexy Right     THYROIDECTOMY     TONSILLECTOMY     Patient Active Problem List   Diagnosis Date Noted   Diverticulosis of large intestine with hemorrhage    Gastroesophageal reflux disease without esophagitis 04/15/2022   Other specified hypothyroidism    Rectal bleeding 04/14/2022   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   Borderline high serum cholesterol 06/24/2015   BP (high blood pressure) 06/24/2015   Cannot sleep 06/24/2015   Carcinoma of thyroid (HCC) 06/24/2015   Reflux 06/24/2015   Osteoporosis 06/24/2015   Arthritis, degenerative 06/24/2015   Abdominal pain 05/01/2014   Constipation 05/01/2014   Diarrhea 05/01/2014   Foreign body of rectum 05/01/2014   Avitaminosis D 10/24/2012   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 Unknown Foley, MD  FOLLOW-UP APPT SCHEDULED WITH REFERRING PROVIDER: Yes ; f/u with surgeon 09/13/23  PERTINENT HISTORY: Pt is an 82 year old female s/p L TKA 08/01/23. Patient reports lot of swelling. Patient reports no significant wound care issues. Pt reports no home health PT since surgery. Patient  reports hx of diabetic neuropathy with some numbness in foot/toes. Pt has some numbness around incision. Past Med hx of HTN, osteoporosis (lower bone density in lumbar spine mainly per pt), OA, post-operative hypothyroidism. Pt reports pain is fairly well controlled with Tramadol; 2x/day and pt is also using Tylenol. Pt reports mild pain at rest. Pt reports doing well with car transfers, getting in/out of house, getting around house. Hx of R  THA.  PAIN:   Pain Intensity: Present: 3-4/10, Best: 1/10, Worst: 8/10 Pain location: medial knee complex, along "path of incision" Pain quality:  burning, throbbing   Swelling: Yes , L knee and leg Numbness/Tingling: Yes; some numbness next to incision, neuropathy in feet  Focal weakness or buckling: No, pt able to ambulate with FWW  Aggravating factors: riding in car, bending knee, transfers e.g. up/down from chair  Relieving factors: Tramadol, Tylenol, cold pack/ice machine   History of prior back, hip, or knee injury, pain, surgery, or therapy: No  Imaging: Yes ; good post-op radiographs   Prior level of function: Independent with community mobility with device Occupational demands: Retired  Presenter, broadcasting: Back to walking exercise program, DIY projects around house  Red flags: Negative for personal history of cancer, chills/fever, night sweats, nausea, vomiting, unexplained weight gain/loss, unrelenting pain  PRECAUTIONS: None  WEIGHT BEARING RESTRICTIONS:  WBAT  FALLS: Has patient fallen in last 6 months? No  Living Environment Lives with: lives with their spouse and her son  Lives in: House/apartment Built in seat shower, safety strips on shower floor, suction grab bar; raised commode. Only small threshold into home from garage. They have upstairs, but everything she uses is on first level.    Patient Goals: return to normal walking, walking routine (up to 1 mi previously, 2 years ago); able to return to exercise classes (e.g. chair aerobics); able to travel (further in car)   OBJECTIVE:   GAIT: Comments: With FWW, Dec L knee terminal knee extension at terminal swing, good heel to toe progression and normal step length, moderate forward lean onto front-wheeled walker, mild dec stance time LLE   AROM AROM (Normal range in degrees) AROM  Hip Right Left      Knee    Flexion (135) WNL 57  Extension (0) WNL -8      Ankle    Dorsiflexion (20)  WNL  Plantarflexion (50)   WNL  Inversion (35)    Eversion (15    (* = pain; Blank rows = not tested)   L knee PROM: -5-78   LE MMT: MMT (out of 5) Right Left  Hip flexion 4 3+  Hip extension    Hip abduction 4+ 4+  Hip adduction 4+ 4+  Hip internal rotation    Hip external rotation    Knee flexion 4+ 4-  Knee extension 4+ 4  Ankle dorsiflexion    Ankle plantarflexion    Ankle inversion    Ankle eversion    (* = pain; Blank rows = not tested)   Palpation Location LEFT  RIGHT           Quadriceps 2 (VMO) 0  Medial Hamstrings 0 0  Lateral Hamstrings 0 0  Lateral Hamstring tendon    Medial Hamstring tendon    Quadriceps tendon 1   Patella 0   Patellar Tendon 1   Tibial Tuberosity    Medial joint line 2   Lateral joint line 1   MCL    LCL    Adductor Tubercle  Pes Anserine tendon    Infrapatellar fat pad    Fibular head    Popliteal fossa    (Blank rows = not tested) Graded on 0-4 scale (0 = no pain, 1 = pain, 2 = pain with wincing/grimacing/flinching, 3 = pain with withdrawal, 4 = unwilling to allow palpation), (Blank rows = not tested)     TODAY'S TREATMENT:   SUBJECTIVE STATEMENT:   Patient reports low level pain up to 2/10. She reports slow progress with ROM. Pt is compliant with HEP.     Manual Therapy - for symptom modulation, soft tissue sensitivity and mobility, joint mobility, ROM   L knee PROM within patient tolerance in supine and sitting (for flexion and extension) x 20 Passive stretch into flexion over edge of table, seated; 3 x 30 sec  TF posterior mobilization; gr III for knee flexion mobility; 2 x 30 sec bouts in hooklying and sitting EOB  Patellar mobilization, gr III; emphasis on superior and inferior glides; 3 x 30 sec bouts  PROM: +2-100 deg    Therapeutic Exercise - for improved soft tissue flexibility and extensibility as needed for ROM, improved strength as needed to improve performance of CKC activities/functional movements  Heel slide with foot on  floor, opposite LE assist; x15, 5 sec hold   Short arc quad, with large blue bolster today; 2x10, 3 sec  Long arc quad; 2x10, 5 sec  Forward and retro steps along blue agility ladder for terminal knee extension; 5x D/B with SPC RUE  NuStep, seat at 7 and moved down to 6 for increased knee flexion; Level 0, x 4 minutes - for L knee AAROM    PATIENT EDUCATION: Reviewed expectations for ROM progression and encouraged continued work on increasing flexion ROM at home as tolerated.   *not today* Ambulate laps with SPC RUE; x 2 laps  -Demo from PT for [facility] gait technique and task practice with pt, PT standby assist Quad set, small rolled up sheet behind knee for tactile input; reviewed for HEP, 1x10 Cold pack (unbilled) - for anti-inflammatory and analgesic effect as needed for reduced pain and improved ability to participate in active PT intervention, along L knee in supine with pillow behind calf, x 5 minutes     PATIENT EDUCATION:  Education details: see above for patient education details  Person educated: Patient and Spouse Education method: Explanation, Demonstration, and Handouts Education comprehension: verbalized understanding and returned demonstration   HOME EXERCISE PROGRAM:  Access Code: [email or web address] URL: [email or web address] Date: 08/23/2023 Prepared by: Consuela Mimes  Exercises - Supine Quad Set  - 3 x daily - 7 x weekly - 2 sets - 10 reps - 5sec hold - Supine Heel Slide with Strap  - 3 x daily - 7 x weekly - 2 sets - 10 reps - 5 sec hold - Seated Long Arc Quad  - 2 x daily - 7 x weekly - 2 sets - 10 reps - 5sec hold - Seated Knee Flexion Slide  - 2 x daily - 7 x weekly - 2 sets - 10 reps - 5sec hold  (+ handout provided for DVT signs)   ASSESSMENT:  CLINICAL IMPRESSION: Patient is an 82 y.o. female s/p L TKA 08/01/23. She is continuing to improve with knee flexion ROM, though she still needs significant work on attaining functional range to not  limit ADLs and to prevent stiffness for longer term. Pt is progressing well with use of SPC for home and community-level mobility and she demonstrates  safe forward and retro stepping today. Patient has remaining post-op impairments in: decreased L knee ROM, decreased patellofemoral/tibiofemoral joint mobility, post-op swelling, decreased LE strength, gait changes, and post-operative pain. Pt will benefit from skilled PT services to address deficits for best return to PLOF.    OBJECTIVE IMPAIRMENTS: Abnormal gait, decreased activity tolerance, decreased mobility, difficulty walking, decreased ROM, decreased strength, hypomobility, increased edema, impaired flexibility, and pain.   ACTIVITY LIMITATIONS: bending, sitting, standing, squatting, stairs, transfers, bed mobility, toileting, and locomotion level  PARTICIPATION LIMITATIONS: meal prep, cleaning, driving, shopping, and community activity  PERSONAL FACTORS: Age and 3+ comorbidities: (HTN, hx of thyroid cancer and post-operative hypothyroidism, GI dysfunction)  are also affecting patient's functional outcome.   REHAB POTENTIAL: Excellent  CLINICAL DECISION MAKING: Evolving/moderate complexity  EVALUATION COMPLEXITY: Moderate   GOALS: Goals reviewed with patient? Yes  SHORT TERM GOALS: Target date: 09/11/2023  Pt will be independent with HEP to improve strength and decrease knee pain to improve pain-free function at home and work. Baseline: 08/21/23: Baseline HEP reviewed/MedBridge handout provided.  Goal status: INITIAL  Pt will improve L knee AROM to 0-100 in first 3-4 weeks of PT as needed for improved ROM needed for transferring, self-care ADLs Baseline: 08/21/23: L knee AROM -8-57 Goal status: INITIAL   LONG TERM GOALS: Target date: 10/19/2023  Pt will increase FOTO to at least 58 to demonstrate significant improvement in function at home and work related to knee pain  Baseline: 08/21/23: 37 Goal status: INITIAL  2.  Pt will  decrease worst knee pain by at least 3 points on the NPRS in order to demonstrate clinically significant reduction in knee pain. Baseline: 08/21/23: 8/10 pain at worst Goal status: INITIAL  3.  Pt will have L knee AROM 0-120 indicative of full ROM as needed for completion of LE management, self-care ADLs, and transfers from various-height surfaces.    Baseline: 08/21/23: -8-57 Goal status: INITIAL  4.  Pt will increase strength of tested lower extremity musculature to at least 4+/5 or greater MMT grade in order to demonstrate improvement in strength and function  Baseline: 08/21/23: LLE strength 3- to 4/5 with exception of modified hip ABD/ADD MMT in sitting.  Goal status: INITIAL  5.  Pt will ambulate without AD at community-level distance (660 ft or greater) without LOB, symmetrical weightbearing, and no major gait deviations  Baseline: 08/21/23: Limited capacity for community-level gait, use of FWW primarily. Goal status: INITIAL   PLAN: PT FREQUENCY: 1-2x/week  PT DURATION: 8 weeks  PLANNED INTERVENTIONS: Therapeutic exercises, Therapeutic activity, Neuromuscular re-education, Balance training, Gait training, Patient/Family education, Self Care, Joint mobilization, Joint manipulation, Vestibular training, Canalith repositioning, Orthotic/Fit training, DME instructions, Dry Needling, Electrical stimulation, Spinal manipulation, Spinal mobilization, Cryotherapy, Moist heat, Taping, Traction, Ultrasound, Ionotophoresis 4mg /ml Dexamethasone, Manual therapy, and Re-evaluation.  PLAN FOR NEXT SESSION: Progress L knee ROM as tolerated, L knee terminal extension, OKC quad isotonics. Progress with weight shifting and gradual AD wean as able.     Consuela Mimes, PT, DPT #[id number]  Gertie Exon, PT 09/06/2023, 9:50 AM

## 2023-09-11 ENCOUNTER — Ambulatory Visit: Payer: Medicare HMO | Admitting: Physical Therapy

## 2023-09-11 ENCOUNTER — Encounter: Payer: Self-pay | Admitting: Physical Therapy

## 2023-09-11 DIAGNOSIS — M25662 Stiffness of left knee, not elsewhere classified: Secondary | ICD-10-CM

## 2023-09-11 DIAGNOSIS — M25562 Pain in left knee: Secondary | ICD-10-CM | POA: Diagnosis not present

## 2023-09-11 DIAGNOSIS — R262 Difficulty in walking, not elsewhere classified: Secondary | ICD-10-CM

## 2023-09-11 DIAGNOSIS — M6281 Muscle weakness (generalized): Secondary | ICD-10-CM

## 2023-09-11 NOTE — Therapy (Signed)
OUTPATIENT PHYSICAL THERAPY POST-OP TREATMENT  Patient Name: Alisha Stevens MRN: [id number] DOB:03/30/1941, 82 y.o., female Today's Date: 09/11/2023   END OF SESSION:  PT End of Session - 09/11/23 0952     Visit Number 6    Number of Visits 17    Date for PT Re-Evaluation 10/19/23    Authorization Type [employer]    PT Start Time [phone]    PT Stop Time 1033    PT Time Calculation (min) 41 min    Equipment Utilized During Treatment --   Pt uses personal front-wheeled walker for mobility   Activity Tolerance Patient tolerated treatment well;Patient limited by pain    Behavior During Therapy WFL for tasks assessed/performed                 Past Medical History:  Diagnosis Date   Cancer (HCC)    Thyroid   Hyperlipemia    Hypertension    Insomnia    Osteoporosis    Personal history of radiation therapy    for thyroid   Reflux    Thyroid disease    Vitamin D deficiency    Past Surgical History:  Procedure Laterality Date   ABDOMINAL HYSTERECTOMY     BREAST SURGERY     BUNIONECTOMY Right    COLONOSCOPY     COLONOSCOPY N/A 04/16/2022   Procedure: COLONOSCOPY;  Surgeon: Jaynie Collins, DO;  Location: [facility] ENDOSCOPY;  Service: Gastroenterology;  Laterality: N/A;   COLONOSCOPY WITH PROPOFOL N/A 06/18/2020   Procedure: COLONOSCOPY WITH PROPOFOL;  Surgeon: Toledo, Boykin Nearing, MD;  Location: [facility] ENDOSCOPY;  Service: Gastroenterology;  Laterality: N/A;   ESOPHAGOGASTRODUODENOSCOPY (EGD) WITH PROPOFOL N/A 06/18/2020   Procedure: ESOPHAGOGASTRODUODENOSCOPY (EGD) WITH PROPOFOL;  Surgeon: Toledo, Boykin Nearing, MD;  Location: [facility] ENDOSCOPY;  Service: Gastroenterology;  Laterality: N/A;   ESOPHAGOGASTRODUODENOSCOPY (EGD) WITH PROPOFOL N/A 04/15/2022   Procedure: ESOPHAGOGASTRODUODENOSCOPY (EGD) WITH PROPOFOL;  Surgeon: Toney Reil, MD;  Location: [facility] ENDOSCOPY;  Service: Gastroenterology;  Laterality: N/A;   HIP ARTHROPLASTY Right    retinopexy  Right    THYROIDECTOMY     TONSILLECTOMY     Patient Active Problem List   Diagnosis Date Noted   Diverticulosis of large intestine with hemorrhage    Gastroesophageal reflux disease without esophagitis 04/15/2022   Other specified hypothyroidism    Rectal bleeding 04/14/2022   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   Borderline high serum cholesterol 06/24/2015   BP (high blood pressure) 06/24/2015   Cannot sleep 06/24/2015   Carcinoma of thyroid (HCC) 06/24/2015   Reflux 06/24/2015   Osteoporosis 06/24/2015   Arthritis, degenerative 06/24/2015   Abdominal pain 05/01/2014   Constipation 05/01/2014   Diarrhea 05/01/2014   Foreign body of rectum 05/01/2014   Avitaminosis D 10/24/2012   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 Unknown Foley, MD  FOLLOW-UP APPT SCHEDULED WITH REFERRING PROVIDER: Yes ; f/u with surgeon 09/13/23  PERTINENT HISTORY: Pt is an 82 year old female s/p L TKA 08/01/23. Patient reports lot of swelling. Patient reports no significant wound care issues. Pt reports no home health PT since surgery.  Patient reports hx of diabetic neuropathy with some numbness in foot/toes. Pt has some numbness around incision. Past Med hx of HTN, osteoporosis (lower bone density in lumbar spine mainly per pt), OA, post-operative hypothyroidism. Pt reports pain is fairly well controlled with Tramadol; 2x/day and pt is also using Tylenol. Pt reports mild pain at rest. Pt reports doing well with car transfers, getting in/out of house, getting around house. Hx of  R THA.  PAIN:   Pain Intensity: Present: 3-4/10, Best: 1/10, Worst: 8/10 Pain location: medial knee complex, along "path of incision" Pain quality:  burning, throbbing   Swelling: Yes , L knee and leg Numbness/Tingling: Yes; some numbness next to incision, neuropathy in feet  Focal weakness or buckling: No, pt able to ambulate with FWW  Aggravating factors: riding in car, bending knee, transfers e.g. up/down from chair  Relieving factors: Tramadol, Tylenol, cold pack/ice machine   History of prior back, hip, or knee injury, pain, surgery, or therapy: No  Imaging: Yes ; good post-op radiographs   Prior level of function: Independent with community mobility with device Occupational demands: Retired  Presenter, broadcasting: Back to walking exercise program, DIY projects around house  Red flags: Negative for personal history of cancer, chills/fever, night sweats, nausea, vomiting, unexplained weight gain/loss, unrelenting pain  PRECAUTIONS: None  WEIGHT BEARING RESTRICTIONS:  WBAT  FALLS: Has patient fallen in last 6 months? No  Living Environment Lives with: lives with their spouse and her son  Lives in: House/apartment Built in seat shower, safety strips on shower floor, suction grab bar; raised commode. Only small threshold into home from garage. They have upstairs, but everything she uses is on first level.    Patient Goals: return to normal walking, walking routine (up to 1 mi previously, 2 years ago); able to return to exercise classes (e.g. chair aerobics); able to travel (further in car)   OBJECTIVE:   GAIT: Comments: With FWW, Dec L knee terminal knee extension at terminal swing, good heel to toe progression and normal step length, moderate forward lean onto front-wheeled walker, mild dec stance time LLE   AROM AROM (Normal range in degrees) AROM  Hip Right Left      Knee    Flexion (135) WNL 57  Extension (0) WNL -8      Ankle    Dorsiflexion (20)  WNL  Plantarflexion  (50)  WNL  Inversion (35)    Eversion (15    (* = pain; Blank rows = not tested)   L knee PROM: -5-78   LE MMT: MMT (out of 5) Right Left  Hip flexion 4 3+  Hip extension    Hip abduction 4+ 4+  Hip adduction 4+ 4+  Hip internal rotation    Hip external rotation    Knee flexion 4+ 4-  Knee extension 4+ 4  Ankle dorsiflexion    Ankle plantarflexion    Ankle inversion    Ankle eversion    (* = pain; Blank rows = not tested)   Palpation Location LEFT  RIGHT           Quadriceps 2 (VMO) 0  Medial Hamstrings 0 0  Lateral Hamstrings 0 0  Lateral Hamstring tendon    Medial Hamstring tendon    Quadriceps tendon 1   Patella 0   Patellar Tendon 1   Tibial Tuberosity    Medial joint line 2   Lateral joint line 1   MCL    LCL    Adductor  Tubercle    Pes Anserine tendon    Infrapatellar fat pad    Fibular head    Popliteal fossa    (Blank rows = not tested) Graded on 0-4 scale (0 = no pain, 1 = pain, 2 = pain with wincing/grimacing/flinching, 3 = pain with withdrawal, 4 = unwilling to allow palpation), (Blank rows = not tested)     TODAY'S TREATMENT:   SUBJECTIVE STATEMENT:   Patient reports taking only 2 pain pills over the weekend. She reports 2/10 pain affecting L knee at arrival to PT. Patient reports doing well with her HEP.     Manual Therapy - for symptom modulation, soft tissue sensitivity and mobility, joint mobility, ROM   L knee PROM within patient tolerance in supine and sitting (for flexion and extension) x 10 Passive stretch into flexion over edge of table, seated; 3 x 30 sec  TF posterior mobilization; gr III for knee flexion mobility; 3 x 30 sec bouts in hooklying and sitting EOB  Patellar mobilization, gr III; emphasis on superior and inferior glides; 2 x 30 sec bouts  PROM: +2-98 deg    Therapeutic Exercise - for improved soft tissue flexibility and extensibility as needed for ROM, improved strength as needed to improve performance of CKC  activities/functional movements  Ambulate laps around gym; x 4 laps  -subjective information gathered  Heel slide with foot on floor, opposite LE assist; x5, 10 sec hold   Long arc quad; 2x10, 5 sec  Forward and retro steps along blue agility ladder for terminal knee extension; 5x D/B with SPC RUE  Standing march adjacent to treadmill armrest for UE support; x20 alternating R/L, for weight shifting and knee ROM  Knee flexion stretch on staircase, forward lunge; x5, 10 sec hold   PATIENT EDUCATION: Discussed current progress and need for continued knee flexion ROM work at home and edema control.   Cold pack (unbilled) - for anti-inflammatory and analgesic effect as needed for reduced pain and improved ability to participate in active PT intervention, along L knee in supine with pillow behind calf, x 3 minutes  -pt elects to complete ice pack after only 3 minutes    *next visit* NuStep, seat at 7 and moved down to 6 for increased knee flexion; Level 0, x 4 minutes - for L knee AAROM   *not today* Short arc quad, with large blue bolster today; 2x10, 3 sec Ambulate laps with SPC RUE; x 2 laps  -Demo from PT for [facility] gait technique and task practice with pt, PT standby assist Quad set, small rolled up sheet behind knee for tactile input; reviewed for HEP, 1x10      PATIENT EDUCATION:  Education details: see above for patient education details  Person educated: Patient and Spouse Education method: Explanation, Demonstration, and Handouts Education comprehension: verbalized understanding and returned demonstration   HOME EXERCISE PROGRAM:  Access Code: [email or web address] URL: [email or web address] Date: 08/23/2023 Prepared by: Consuela Mimes  Exercises - Supine Quad Set  - 3 x daily - 7 x weekly - 2 sets - 10 reps - 5sec hold - Supine Heel Slide with Strap  - 3 x daily - 7 x weekly - 2 sets - 10 reps - 5 sec hold - Seated Long Arc Quad  - 2 x daily - 7 x weekly - 2  sets - 10 reps - 5sec hold - Seated Knee Flexion Slide  - 2 x daily - 7 x weekly - 2 sets - 10 reps -  5sec hold  (+ handout provided for DVT signs)   ASSESSMENT:  CLINICAL IMPRESSION: Patient has WFL L terminal knee extension and is progressing well with stepping and weight-shifting work in standing. She demonstrates safe negotiation of clinic with use of SPC. Pt's main limitation is significantly decreased L knee flexion ROM. Pt only able to get up to 98 deg with passive stretching today; 95-96 deg with AAROM exercise. Patient has remaining post-op impairments in: decreased L knee ROM, decreased patellofemoral/tibiofemoral joint mobility, post-op swelling, decreased LE strength, gait changes, and post-operative pain. Pt will benefit from skilled PT services to address deficits for best return to PLOF.    OBJECTIVE IMPAIRMENTS: Abnormal gait, decreased activity tolerance, decreased mobility, difficulty walking, decreased ROM, decreased strength, hypomobility, increased edema, impaired flexibility, and pain.   ACTIVITY LIMITATIONS: bending, sitting, standing, squatting, stairs, transfers, bed mobility, toileting, and locomotion level  PARTICIPATION LIMITATIONS: meal prep, cleaning, driving, shopping, and community activity  PERSONAL FACTORS: Age and 3+ comorbidities: (HTN, hx of thyroid cancer and post-operative hypothyroidism, GI dysfunction)  are also affecting patient's functional outcome.   REHAB POTENTIAL: Excellent  CLINICAL DECISION MAKING: Evolving/moderate complexity  EVALUATION COMPLEXITY: Moderate   GOALS: Goals reviewed with patient? Yes  SHORT TERM GOALS: Target date: 09/11/2023  Pt will be independent with HEP to improve strength and decrease knee pain to improve pain-free function at home and work. Baseline: 08/21/23: Baseline HEP reviewed/Med[employer] handout provided.  Goal status: INITIAL  Pt will improve L knee AROM to 0-100 in first 3-4 weeks of PT as needed for  improved ROM needed for transferring, self-care ADLs Baseline: 08/21/23: L knee AROM -8-57 Goal status: INITIAL   LONG TERM GOALS: Target date: 10/19/2023  Pt will increase FOTO to at least 58 to demonstrate significant improvement in function at home and work related to knee pain  Baseline: 08/21/23: 37 Goal status: INITIAL  2.  Pt will decrease worst knee pain by at least 3 points on the NPRS in order to demonstrate clinically significant reduction in knee pain. Baseline: 08/21/23: 8/10 pain at worst Goal status: INITIAL  3.  Pt will have L knee AROM 0-120 indicative of full ROM as needed for completion of LE management, self-care ADLs, and transfers from various-height surfaces.    Baseline: 08/21/23: -8-57 Goal status: INITIAL  4.  Pt will increase strength of tested lower extremity musculature to at least 4+/5 or greater MMT grade in order to demonstrate improvement in strength and function  Baseline: 08/21/23: LLE strength 3- to 4/5 with exception of modified hip ABD/ADD MMT in sitting.  Goal status: INITIAL  5.  Pt will ambulate without AD at community-level distance (660 ft or greater) without LOB, symmetrical weightbearing, and no major gait deviations  Baseline: 08/21/23: Limited capacity for community-level gait, use of FWW primarily. Goal status: INITIAL   PLAN: PT FREQUENCY: 1-2x/week  PT DURATION: 8 weeks  PLANNED INTERVENTIONS: Therapeutic exercises, Therapeutic activity, Neuromuscular re-education, Balance training, Gait training, Patient/Family education, Self Care, Joint mobilization, Joint manipulation, Vestibular training, Canalith repositioning, Orthotic/Fit training, DME instructions, Dry Needling, Electrical stimulation, Spinal manipulation, Spinal mobilization, Cryotherapy, Moist heat, Taping, Traction, Ultrasound, Ionotophoresis 4mg /ml Dexamethasone, Manual therapy, and Re-evaluation.  PLAN FOR NEXT SESSION: Progress L knee ROM as tolerated, L knee terminal  extension, OKC quad isotonics. Progress with weight shifting and gradual AD wean as able.     Consuela Mimes, PT, DPT #[id number]  Gertie Exon, PT 09/11/2023, 10:46 AM

## 2023-09-13 ENCOUNTER — Encounter: Payer: Self-pay | Admitting: Physical Therapy

## 2023-09-13 ENCOUNTER — Ambulatory Visit: Payer: Medicare HMO | Admitting: Physical Therapy

## 2023-09-13 DIAGNOSIS — M25662 Stiffness of left knee, not elsewhere classified: Secondary | ICD-10-CM

## 2023-09-13 DIAGNOSIS — M6281 Muscle weakness (generalized): Secondary | ICD-10-CM

## 2023-09-13 DIAGNOSIS — R262 Difficulty in walking, not elsewhere classified: Secondary | ICD-10-CM

## 2023-09-13 DIAGNOSIS — M25562 Pain in left knee: Secondary | ICD-10-CM | POA: Diagnosis not present

## 2023-09-13 NOTE — Therapy (Signed)
OUTPATIENT PHYSICAL THERAPY POST-OP TREATMENT  Patient Name: Alisha Stevens MRN: [id number] DOB:[birth year], 82 y.o., female Today's Date: 09/13/2023   END OF SESSION:  PT End of Session - 09/13/23 0949     Visit Number 7    Number of Visits 17    Date for PT Re-Evaluation 10/19/23    Authorization Type [employer]    PT Start Time [phone]    PT Stop Time 1028    PT Time Calculation (min) 39 min    Equipment Utilized During Treatment --   Pt uses personal front-wheeled walker for mobility   Activity Tolerance Patient tolerated treatment well;Patient limited by pain    Behavior During Therapy WFL for tasks assessed/performed                  Past Medical History:  Diagnosis Date   Cancer (HCC)    Thyroid   Hyperlipemia    Hypertension    Insomnia    Osteoporosis    Personal history of radiation therapy    for thyroid   Reflux    Thyroid disease    Vitamin D deficiency    Past Surgical History:  Procedure Laterality Date   ABDOMINAL HYSTERECTOMY     BREAST SURGERY     BUNIONECTOMY Right    COLONOSCOPY     COLONOSCOPY N/A 04/16/2022   Procedure: COLONOSCOPY;  Surgeon: Jaynie Collins, DO;  Location: [facility] ENDOSCOPY;  Service: Gastroenterology;  Laterality: N/A;   COLONOSCOPY WITH PROPOFOL N/A 06/18/2020   Procedure: COLONOSCOPY WITH PROPOFOL;  Surgeon: Toledo, Boykin Nearing, MD;  Location: [facility] ENDOSCOPY;  Service: Gastroenterology;  Laterality: N/A;   ESOPHAGOGASTRODUODENOSCOPY (EGD) WITH PROPOFOL N/A 06/18/2020   Procedure: ESOPHAGOGASTRODUODENOSCOPY (EGD) WITH PROPOFOL;  Surgeon: Toledo, Boykin Nearing, MD;  Location: [facility] ENDOSCOPY;  Service: Gastroenterology;  Laterality: N/A;   ESOPHAGOGASTRODUODENOSCOPY (EGD) WITH PROPOFOL N/A 04/15/2022   Procedure: ESOPHAGOGASTRODUODENOSCOPY (EGD) WITH PROPOFOL;  Surgeon: Toney Reil, MD;  Location: [facility] ENDOSCOPY;  Service: Gastroenterology;  Laterality: N/A;   HIP ARTHROPLASTY Right    retinopexy  Right    THYROIDECTOMY     TONSILLECTOMY     Patient Active Problem List   Diagnosis Date Noted   Diverticulosis of large intestine with hemorrhage    Gastroesophageal reflux disease without esophagitis 04/15/2022   Other specified hypothyroidism    Rectal bleeding 04/14/2022   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   Borderline high serum cholesterol 06/24/2015   BP (high blood pressure) 06/24/2015   Cannot sleep 06/24/2015   Carcinoma of thyroid (HCC) 06/24/2015   Reflux 06/24/2015   Osteoporosis 06/24/2015   Arthritis, degenerative 06/24/2015   Abdominal pain 05/01/2014   Constipation 05/01/2014   Diarrhea 05/01/2014   Foreign body of rectum 05/01/2014   Avitaminosis D 10/24/2012   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 Unknown Foley, MD  FOLLOW-UP APPT SCHEDULED WITH REFERRING PROVIDER: Yes ; f/u with surgeon 09/13/23  PERTINENT HISTORY: Pt is an 82 year old female s/p L TKA 08/01/23. Patient reports lot of swelling. Patient reports no significant wound care issues. Pt reports no home health PT since  surgery. Patient reports hx of diabetic neuropathy with some numbness in foot/toes. Pt has some numbness around incision. Past Med hx of HTN, osteoporosis (lower bone density in lumbar spine mainly per pt), OA, post-operative hypothyroidism. Pt reports pain is fairly well controlled with Tramadol; 2x/day and pt is also using Tylenol. Pt reports mild pain at rest. Pt reports doing well with car transfers, getting in/out of house, getting around house. Hx of  R THA.  PAIN:   Pain Intensity: Present: 3-4/10, Best: 1/10, Worst: 8/10 Pain location: medial knee complex, along "path of incision" Pain quality:  burning, throbbing   Swelling: Yes , L knee and leg Numbness/Tingling: Yes; some numbness next to incision, neuropathy in feet  Focal weakness or buckling: No, pt able to ambulate with FWW  Aggravating factors: riding in car, bending knee, transfers e.g. up/down from chair  Relieving factors: Tramadol, Tylenol, cold pack/ice machine   History of prior back, hip, or knee injury, pain, surgery, or therapy: No  Imaging: Yes ; good post-op radiographs   Prior level of function: Independent with community mobility with device Occupational demands: Retired  Presenter, broadcasting: Back to walking exercise program, DIY projects around house  Red flags: Negative for personal history of cancer, chills/fever, night sweats, nausea, vomiting, unexplained weight gain/loss, unrelenting pain  PRECAUTIONS: None  WEIGHT BEARING RESTRICTIONS:  WBAT  FALLS: Has patient fallen in last 6 months? No  Living Environment Lives with: lives with their spouse and her son  Lives in: House/apartment Built in seat shower, safety strips on shower floor, suction grab bar; raised commode. Only small threshold into home from garage. They have upstairs, but everything she uses is on first level.    Patient Goals: return to normal walking, walking routine (up to 1 mi previously, 2 years ago); able to return to exercise classes (e.g. chair aerobics); able to travel (further in car)   OBJECTIVE:   GAIT: Comments: With FWW, Dec L knee terminal knee extension at terminal swing, good heel to toe progression and normal step length, moderate forward lean onto front-wheeled walker, mild dec stance time LLE   AROM AROM (Normal range in degrees) AROM  Hip Right Left      Knee    Flexion (135) WNL 57  Extension (0) WNL -8      Ankle    Dorsiflexion (20)  WNL  Plantarflexion  (50)  WNL  Inversion (35)    Eversion (15    (* = pain; Blank rows = not tested)   L knee PROM: -5-78   LE MMT: MMT (out of 5) Right Left  Hip flexion 4 3+  Hip extension    Hip abduction 4+ 4+  Hip adduction 4+ 4+  Hip internal rotation    Hip external rotation    Knee flexion 4+ 4-  Knee extension 4+ 4  Ankle dorsiflexion    Ankle plantarflexion    Ankle inversion    Ankle eversion    (* = pain; Blank rows = not tested)   Palpation Location LEFT  RIGHT           Quadriceps 2 (VMO) 0  Medial Hamstrings 0 0  Lateral Hamstrings 0 0  Lateral Hamstring tendon    Medial Hamstring tendon    Quadriceps tendon 1   Patella 0   Patellar Tendon 1   Tibial Tuberosity    Medial joint line 2   Lateral joint line 1   MCL    LCL  Adductor Tubercle    Pes Anserine tendon    Infrapatellar fat pad    Fibular head    Popliteal fossa    (Blank rows = not tested) Graded on 0-4 scale (0 = no pain, 1 = pain, 2 = pain with wincing/grimacing/flinching, 3 = pain with withdrawal, 4 = unwilling to allow palpation), (Blank rows = not tested)     TODAY'S TREATMENT:   SUBJECTIVE STATEMENT:   Patient reports feeling sluggish due to difficulty sleeping last night. She reports discomfort in L knee at arrival - "maybe 2/10 pain." Patient reports compliance with her HEP.     Manual Therapy - for symptom modulation, soft tissue sensitivity and mobility, joint mobility, ROM   L knee PROM within patient tolerance in supine and sitting (for flexion and extension) x 10 Passive stretch into flexion over edge of table, seated; 3 x 30 sec  TF posterior mobilization; gr III for knee flexion mobility; 3 x 30 sec bouts in hooklying  Patellar mobilization, gr III; emphasis on superior and inferior glides; 3 x 30 sec bouts  PROM: +2-102 deg    Therapeutic Exercise - for improved soft tissue flexibility and extensibility as needed for ROM, improved strength as needed to improve performance of  CKC activities/functional movements   Heel slide with foot on floor, opposite LE assist; x10, 10 sec hold   Long arc quad; 2x10, 5 sec  Dynamic march in // bars; 5x D/B   NuStep, seat at 6 for increased knee flexion; Level 0, x 4 minutes - for L knee AAROM   PATIENT EDUCATION: Discussed current progress and need for continued knee flexion ROM work at home and edema control.   Cold pack (unbilled) - for anti-inflammatory and analgesic effect as needed for reduced pain and improved ability to participate in active PT intervention, along L knee in supine with pillow behind calf, x 5 minutes     *next visit* Forward and retro steps along blue agility ladder for terminal knee extension; 5x D/B with SPC RUE Knee flexion stretch on staircase, forward lunge; x5, 10 sec hold  *not today* Standing march adjacent to treadmill armrest for UE support; x20 alternating R/L, for weight shifting and knee ROM Ambulate laps around gym; x 4 laps  -subjective information gathered Short arc quad, with large blue bolster today; 2x10, 3 sec Ambulate laps with SPC RUE; x 2 laps  -Demo from PT for [facility] gait technique and task practice with pt, PT standby assist Quad set, small rolled up sheet behind knee for tactile input; reviewed for HEP, 1x10      PATIENT EDUCATION:  Education details: see above for patient education details  Person educated: Patient and Spouse Education method: Explanation, Demonstration, and Handouts Education comprehension: verbalized understanding and returned demonstration   HOME EXERCISE PROGRAM:  Access Code: [email or web address] URL: [email or web address] Date: 08/23/2023 Prepared by: Consuela Mimes  Exercises - Supine Quad Set  - 3 x daily - 7 x weekly - 2 sets - 10 reps - 5sec hold - Supine Heel Slide with Strap  - 3 x daily - 7 x weekly - 2 sets - 10 reps - 5 sec hold - Seated Long Arc Quad  - 2 x daily - 7 x weekly - 2 sets - 10 reps - 5sec hold - Seated  Knee Flexion Slide  - 2 x daily - 7 x weekly - 2 sets - 10 reps - 5sec hold  (+ handout provided for DVT signs)  ASSESSMENT:  CLINICAL IMPRESSION: Patient has L knee flexion up to 102 deg today. She exhibits sound quad activation and stable, safe gait pattern with SPC. She is most limited with knee flexion ROM - this needs continued improvement for pt to achieve functional ROM required for self-care/ADLs. Patient has remaining post-op impairments in: decreased L knee ROM, decreased patellofemoral/tibiofemoral joint mobility, post-op swelling, decreased LE strength, gait changes, and post-operative pain. Pt will benefit from skilled PT services to address deficits for best return to PLOF.    OBJECTIVE IMPAIRMENTS: Abnormal gait, decreased activity tolerance, decreased mobility, difficulty walking, decreased ROM, decreased strength, hypomobility, increased edema, impaired flexibility, and pain.   ACTIVITY LIMITATIONS: bending, sitting, standing, squatting, stairs, transfers, bed mobility, toileting, and locomotion level  PARTICIPATION LIMITATIONS: meal prep, cleaning, driving, shopping, and community activity  PERSONAL FACTORS: Age and 3+ comorbidities: (HTN, hx of thyroid cancer and post-operative hypothyroidism, GI dysfunction)  are also affecting patient's functional outcome.   REHAB POTENTIAL: Excellent  CLINICAL DECISION MAKING: Evolving/moderate complexity  EVALUATION COMPLEXITY: Moderate   GOALS: Goals reviewed with patient? Yes  SHORT TERM GOALS: Target date: 09/11/2023  Pt will be independent with HEP to improve strength and decrease knee pain to improve pain-free function at home and work. Baseline: 08/21/23: Baseline HEP reviewed/Med[employer] handout provided.  Goal status: INITIAL  Pt will improve L knee AROM to 0-100 in first 3-4 weeks of PT as needed for improved ROM needed for transferring, self-care ADLs Baseline: 08/21/23: L knee AROM -8-57 Goal status:  INITIAL   LONG TERM GOALS: Target date: 10/19/2023  Pt will increase FOTO to at least 58 to demonstrate significant improvement in function at home and work related to knee pain  Baseline: 08/21/23: 37 Goal status: INITIAL  2.  Pt will decrease worst knee pain by at least 3 points on the NPRS in order to demonstrate clinically significant reduction in knee pain. Baseline: 08/21/23: 8/10 pain at worst Goal status: INITIAL  3.  Pt will have L knee AROM 0-120 indicative of full ROM as needed for completion of LE management, self-care ADLs, and transfers from various-height surfaces.    Baseline: 08/21/23: -8-57 Goal status: INITIAL  4.  Pt will increase strength of tested lower extremity musculature to at least 4+/5 or greater MMT grade in order to demonstrate improvement in strength and function  Baseline: 08/21/23: LLE strength 3- to 4/5 with exception of modified hip ABD/ADD MMT in sitting.  Goal status: INITIAL  5.  Pt will ambulate without AD at community-level distance (660 ft or greater) without LOB, symmetrical weightbearing, and no major gait deviations  Baseline: 08/21/23: Limited capacity for community-level gait, use of FWW primarily. Goal status: INITIAL   PLAN: PT FREQUENCY: 1-2x/week  PT DURATION: 8 weeks  PLANNED INTERVENTIONS: Therapeutic exercises, Therapeutic activity, Neuromuscular re-education, Balance training, Gait training, Patient/Family education, Self Care, Joint mobilization, Joint manipulation, Vestibular training, Canalith repositioning, Orthotic/Fit training, DME instructions, Dry Needling, Electrical stimulation, Spinal manipulation, Spinal mobilization, Cryotherapy, Moist heat, Taping, Traction, Ultrasound, Ionotophoresis 4mg /ml Dexamethasone, Manual therapy, and Re-evaluation.  PLAN FOR NEXT SESSION: Progress L knee ROM as tolerated, [place] quad isotonics. Progress with weight shifting and gradual AD wean as able.     Consuela Mimes, PT, DPT  #[id number]  Gertie Exon, PT 09/13/2023, 4:25 PM

## 2023-09-18 ENCOUNTER — Ambulatory Visit: Payer: Medicare HMO | Admitting: Physical Therapy

## 2023-09-20 ENCOUNTER — Ambulatory Visit: Payer: Medicare HMO | Admitting: Physical Therapy

## 2023-09-20 DIAGNOSIS — M25662 Stiffness of left knee, not elsewhere classified: Secondary | ICD-10-CM

## 2023-09-20 DIAGNOSIS — M25562 Pain in left knee: Secondary | ICD-10-CM

## 2023-09-20 DIAGNOSIS — M6281 Muscle weakness (generalized): Secondary | ICD-10-CM

## 2023-09-20 DIAGNOSIS — R262 Difficulty in walking, not elsewhere classified: Secondary | ICD-10-CM

## 2023-09-20 NOTE — Therapy (Signed)
OUTPATIENT PHYSICAL THERAPY POST-OP TREATMENT  Patient Name: Alisha Stevens MRN: [id number] DOB:07-27-1941, 82 y.o., female Today's Date: 09/20/2023   END OF SESSION:  PT End of Session - 09/20/23 0959     Visit Number 8    Number of Visits 17    Date for PT Re-Evaluation 10/19/23    Authorization Type [employer]    PT Start Time [phone]    PT Stop Time 1030    PT Time Calculation (min) 40 min    Equipment Utilized During Treatment --   Pt uses personal front-wheeled walker for mobility   Activity Tolerance Patient tolerated treatment well;Patient limited by pain    Behavior During Therapy WFL for tasks assessed/performed                  Past Medical History:  Diagnosis Date   Cancer (HCC)    Thyroid   Hyperlipemia    Hypertension    Insomnia    Osteoporosis    Personal history of radiation therapy    for thyroid   Reflux    Thyroid disease    Vitamin D deficiency    Past Surgical History:  Procedure Laterality Date   ABDOMINAL HYSTERECTOMY     BREAST SURGERY     BUNIONECTOMY Right    COLONOSCOPY     COLONOSCOPY N/A 04/16/2022   Procedure: COLONOSCOPY;  Surgeon: Jaynie Collins, DO;  Location: [facility] ENDOSCOPY;  Service: Gastroenterology;  Laterality: N/A;   COLONOSCOPY WITH PROPOFOL N/A 06/18/2020   Procedure: COLONOSCOPY WITH PROPOFOL;  Surgeon: Toledo, Boykin Nearing, MD;  Location: [facility] ENDOSCOPY;  Service: Gastroenterology;  Laterality: N/A;   ESOPHAGOGASTRODUODENOSCOPY (EGD) WITH PROPOFOL N/A 06/18/2020   Procedure: ESOPHAGOGASTRODUODENOSCOPY (EGD) WITH PROPOFOL;  Surgeon: Toledo, Boykin Nearing, MD;  Location: [facility] ENDOSCOPY;  Service: Gastroenterology;  Laterality: N/A;   ESOPHAGOGASTRODUODENOSCOPY (EGD) WITH PROPOFOL N/A 04/15/2022   Procedure: ESOPHAGOGASTRODUODENOSCOPY (EGD) WITH PROPOFOL;  Surgeon: Toney Reil, MD;  Location: [facility] ENDOSCOPY;  Service: Gastroenterology;  Laterality: N/A;   HIP ARTHROPLASTY Right    retinopexy  Right    THYROIDECTOMY     TONSILLECTOMY     Patient Active Problem List   Diagnosis Date Noted   Diverticulosis of large intestine with hemorrhage    Gastroesophageal reflux disease without esophagitis 04/15/2022   Other specified hypothyroidism    Rectal bleeding 04/14/2022   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   Borderline high serum cholesterol 06/24/2015   BP (high blood pressure) 06/24/2015   Cannot sleep 06/24/2015   Carcinoma of thyroid (HCC) 06/24/2015   Reflux 06/24/2015   Osteoporosis 06/24/2015   Arthritis, degenerative 06/24/2015   Abdominal pain 05/01/2014   Constipation 05/01/2014   Diarrhea 05/01/2014   Foreign body of rectum 05/01/2014   Avitaminosis D 10/24/2012   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 Unknown Foley, MD  FOLLOW-UP APPT SCHEDULED WITH REFERRING PROVIDER: Yes ; f/u with surgeon 09/13/23  PERTINENT HISTORY: Pt is an 82 year old female s/p L TKA 08/01/23. Patient reports lot of swelling. Patient reports no significant wound care issues. Pt reports no home health PT since  surgery. Patient reports hx of diabetic neuropathy with some numbness in foot/toes. Pt has some numbness around incision. Past Med hx of HTN, osteoporosis (lower bone density in lumbar spine mainly per pt), OA, post-operative hypothyroidism. Pt reports pain is fairly well controlled with Tramadol; 2x/day and pt is also using Tylenol. Pt reports mild pain at rest. Pt reports doing well with car transfers, getting in/out of house, getting around house. Hx of  R THA.  PAIN:   Pain Intensity: Present: 3-4/10, Best: 1/10, Worst: 8/10 Pain location: medial knee complex, along "path of incision" Pain quality:  burning, throbbing   Swelling: Yes , L knee and leg Numbness/Tingling: Yes; some numbness next to incision, neuropathy in feet  Focal weakness or buckling: No, pt able to ambulate with FWW  Aggravating factors: riding in car, bending knee, transfers e.g. up/down from chair  Relieving factors: Tramadol, Tylenol, cold pack/ice machine   History of prior back, hip, or knee injury, pain, surgery, or therapy: No  Imaging: Yes ; good post-op radiographs   Prior level of function: Independent with community mobility with device Occupational demands: Retired  Presenter, broadcasting: Back to walking exercise program, DIY projects around house  Red flags: Negative for personal history of cancer, chills/fever, night sweats, nausea, vomiting, unexplained weight gain/loss, unrelenting pain  PRECAUTIONS: None  WEIGHT BEARING RESTRICTIONS:  WBAT  FALLS: Has patient fallen in last 6 months? No  Living Environment Lives with: lives with their spouse and her son  Lives in: House/apartment Built in seat shower, safety strips on shower floor, suction grab bar; raised commode. Only small threshold into home from garage. They have upstairs, but everything she uses is on first level.    Patient Goals: return to normal walking, walking routine (up to 1 mi previously, 2 years ago); able to return to exercise classes (e.g. chair aerobics); able to travel (further in car)   OBJECTIVE:   GAIT: Comments: With FWW, Dec L knee terminal knee extension at terminal swing, good heel to toe progression and normal step length, moderate forward lean onto front-wheeled walker, mild dec stance time LLE   AROM AROM (Normal range in degrees) AROM  Hip Right Left      Knee    Flexion (135) WNL 57  Extension (0) WNL -8      Ankle    Dorsiflexion (20)  WNL  Plantarflexion  (50)  WNL  Inversion (35)    Eversion (15    (* = pain; Blank rows = not tested)   L knee PROM: -5-78   LE MMT: MMT (out of 5) Right Left  Hip flexion 4 3+  Hip extension    Hip abduction 4+ 4+  Hip adduction 4+ 4+  Hip internal rotation    Hip external rotation    Knee flexion 4+ 4-  Knee extension 4+ 4  Ankle dorsiflexion    Ankle plantarflexion    Ankle inversion    Ankle eversion    (* = pain; Blank rows = not tested)   Palpation Location LEFT  RIGHT           Quadriceps 2 (VMO) 0  Medial Hamstrings 0 0  Lateral Hamstrings 0 0  Lateral Hamstring tendon    Medial Hamstring tendon    Quadriceps tendon 1   Patella 0   Patellar Tendon 1   Tibial Tuberosity    Medial joint line 2   Lateral joint line 1   MCL    LCL  Adductor Tubercle    Pes Anserine tendon    Infrapatellar fat pad    Fibular head    Popliteal fossa    (Blank rows = not tested) Graded on 0-4 scale (0 = no pain, 1 = pain, 2 = pain with wincing/grimacing/flinching, 3 = pain with withdrawal, 4 = unwilling to allow palpation), (Blank rows = not tested)     TODAY'S TREATMENT:   SUBJECTIVE STATEMENT:   Patient reports main pain at arrival - 1/10 NPRS. She arrives without [facility] today and reports continued work on ROM at home.     Manual Therapy - for symptom modulation, soft tissue sensitivity and mobility, joint mobility, ROM   L knee PROM within patient tolerance in supine and sitting (for flexion and extension) x 10 Passive stretch into flexion over edge of table, seated; 3 x 30 sec  TF posterior mobilization; gr III for knee flexion mobility; 3 x 30 sec bouts in hooklying  Patellar mobilization, gr III; emphasis on superior and inferior glides; 3 x 30 sec bouts  PROM: +2-105 deg    Therapeutic Exercise - for improved soft tissue flexibility and extensibility as needed for ROM, improved strength as needed to improve performance of [facility] activities/functional movements   Heel slide  with foot on floor, opposite LE assist; x10, 10 sec hold   Long arc quad; 2x10, 5 sec  Dynamic march in // bars; 5x D/B   NuStep, seat at 6 for increased knee flexion; Level 0, x 4 minutes - for L knee AAROM   PATIENT EDUCATION: Discussed current progress and need for continued knee flexion ROM work at home and edema control.   Cold pack (unbilled) - for anti-inflammatory and analgesic effect as needed for reduced pain and improved ability to participate in active PT intervention, along L knee in supine with pillow behind calf, x 5 minutes     *next visit* Forward and retro steps along blue agility ladder for terminal knee extension; 5x D/B with SPC RUE Knee flexion stretch on staircase, forward lunge; x5, 10 sec hold  *not today* Standing march adjacent to treadmill armrest for UE support; x20 alternating R/L, for weight shifting and knee ROM Ambulate laps around gym; x 4 laps  -subjective information gathered Short arc quad, with large blue bolster today; 2x10, 3 sec Ambulate laps with SPC RUE; x 2 laps  -Demo from PT for [facility] gait technique and task practice with pt, PT standby assist Quad set, small rolled up sheet behind knee for tactile input; reviewed for HEP, 1x10      PATIENT EDUCATION:  Education details: see above for patient education details  Person educated: Patient and Spouse Education method: Explanation, Demonstration, and Handouts Education comprehension: verbalized understanding and returned demonstration   HOME EXERCISE PROGRAM:  Access Code: [email or web address] URL: [email or web address] Date: 08/23/2023 Prepared by: Consuela Mimes  Exercises - Supine Quad Set  - 3 x daily - 7 x weekly - 2 sets - 10 reps - 5sec hold - Supine Heel Slide with Strap  - 3 x daily - 7 x weekly - 2 sets - 10 reps - 5 sec hold - Seated Long Arc Quad  - 2 x daily - 7 x weekly - 2 sets - 10 reps - 5sec hold - Seated Knee Flexion Slide  - 2 x daily - 7 x weekly - 2  sets - 10 reps - 5sec hold  (+ handout provided for DVT signs)   ASSESSMENT:  CLINICAL IMPRESSION: Patient is  still able to get knee flexion ROM > 100 deg today. She is nearing [facility] knee flexion ROM, but progress has been slow due to significant stiffness at outset of PT. Patient is making excellent progress with AD wean and is able to negotiate clinic without device at this time. Patient has remaining post-op impairments in: decreased L knee ROM, decreased patellofemoral/tibiofemoral joint mobility, post-op swelling, decreased LE strength, gait changes, and post-operative pain. Pt will benefit from skilled PT services to address deficits for best return to PLOF.    OBJECTIVE IMPAIRMENTS: Abnormal gait, decreased activity tolerance, decreased mobility, difficulty walking, decreased ROM, decreased strength, hypomobility, increased edema, impaired flexibility, and pain.   ACTIVITY LIMITATIONS: bending, sitting, standing, squatting, stairs, transfers, bed mobility, toileting, and locomotion level  PARTICIPATION LIMITATIONS: meal prep, cleaning, driving, shopping, and community activity  PERSONAL FACTORS: Age and 3+ comorbidities: (HTN, hx of thyroid cancer and post-operative hypothyroidism, GI dysfunction)  are also affecting patient's functional outcome.   REHAB POTENTIAL: Excellent  CLINICAL DECISION MAKING: Evolving/moderate complexity  EVALUATION COMPLEXITY: Moderate   GOALS: Goals reviewed with patient? Yes  SHORT TERM GOALS: Target date: 09/11/2023  Pt will be independent with HEP to improve strength and decrease knee pain to improve pain-free function at home and work. Baseline: 08/21/23: Baseline HEP reviewed/MedBridge handout provided.  Goal status: INITIAL  Pt will improve L knee AROM to 0-100 in first 3-4 weeks of PT as needed for improved ROM needed for transferring, self-care ADLs Baseline: 08/21/23: L knee AROM -8-57 Goal status: INITIAL   LONG TERM GOALS: Target date:  10/19/2023  Pt will increase FOTO to at least 58 to demonstrate significant improvement in function at home and work related to knee pain  Baseline: 08/21/23: 37 Goal status: INITIAL  2.  Pt will decrease worst knee pain by at least 3 points on the NPRS in order to demonstrate clinically significant reduction in knee pain. Baseline: 08/21/23: 8/10 pain at worst Goal status: INITIAL  3.  Pt will have L knee AROM 0-120 indicative of full ROM as needed for completion of LE management, self-care ADLs, and transfers from various-height surfaces.    Baseline: 08/21/23: -8-57 Goal status: INITIAL  4.  Pt will increase strength of tested lower extremity musculature to at least 4+/5 or greater MMT grade in order to demonstrate improvement in strength and function  Baseline: 08/21/23: LLE strength 3- to 4/5 with exception of modified hip ABD/ADD MMT in sitting.  Goal status: INITIAL  5.  Pt will ambulate without AD at community-level distance (660 ft or greater) without LOB, symmetrical weightbearing, and no major gait deviations  Baseline: 08/21/23: Limited capacity for community-level gait, use of FWW primarily. Goal status: INITIAL   PLAN: PT FREQUENCY: 1-2x/week  PT DURATION: 8 weeks  PLANNED INTERVENTIONS: Therapeutic exercises, Therapeutic activity, Neuromuscular re-education, Balance training, Gait training, Patient/Family education, Self Care, Joint mobilization, Joint manipulation, Vestibular training, Canalith repositioning, Orthotic/Fit training, DME instructions, Dry Needling, Electrical stimulation, Spinal manipulation, Spinal mobilization, Cryotherapy, Moist heat, Taping, Traction, Ultrasound, Ionotophoresis 4mg /ml Dexamethasone, Manual therapy, and Re-evaluation.  PLAN FOR NEXT SESSION: Progress L knee ROM as tolerated, [place] quad isotonics. Progress with weight shifting and gradual AD wean as able.    Consuela Mimes, PT, DPT #[id number]  Gertie Exon, PT 09/20/2023, 10:00 AM

## 2023-09-25 ENCOUNTER — Ambulatory Visit: Payer: Medicare HMO | Attending: Orthopedic Surgery | Admitting: Physical Therapy

## 2023-09-25 ENCOUNTER — Encounter: Payer: Self-pay | Admitting: Physical Therapy

## 2023-09-25 DIAGNOSIS — M25562 Pain in left knee: Secondary | ICD-10-CM

## 2023-09-25 DIAGNOSIS — R262 Difficulty in walking, not elsewhere classified: Secondary | ICD-10-CM

## 2023-09-25 DIAGNOSIS — M6281 Muscle weakness (generalized): Secondary | ICD-10-CM | POA: Diagnosis present

## 2023-09-25 DIAGNOSIS — M25662 Stiffness of left knee, not elsewhere classified: Secondary | ICD-10-CM

## 2023-09-25 NOTE — Therapy (Signed)
OUTPATIENT PHYSICAL THERAPY POST-OP TREATMENT  Patient Name: Alisha Stevens MRN: [id number] DOB:July 03, 1941, 82 y.o., female Today's Date: 09/25/2023   END OF SESSION:  PT End of Session - 09/25/23 0951     Visit Number 9    Number of Visits 17    Date for PT Re-Evaluation 10/19/23    Authorization Type [employer]    PT Start Time [phone]    PT Stop Time 1036    PT Time Calculation (min) 46 min    Equipment Utilized During Treatment --    Activity Tolerance Patient tolerated treatment well;Patient limited by pain    Behavior During Therapy WFL for tasks assessed/performed                   Past Medical History:  Diagnosis Date   Cancer (HCC)    Thyroid   Hyperlipemia    Hypertension    Insomnia    Osteoporosis    Personal history of radiation therapy    for thyroid   Reflux    Thyroid disease    Vitamin D deficiency    Past Surgical History:  Procedure Laterality Date   ABDOMINAL HYSTERECTOMY     BREAST SURGERY     BUNIONECTOMY Right    COLONOSCOPY     COLONOSCOPY N/A 04/16/2022   Procedure: COLONOSCOPY;  Surgeon: Jaynie Collins, DO;  Location: [facility] ENDOSCOPY;  Service: Gastroenterology;  Laterality: N/A;   COLONOSCOPY WITH PROPOFOL N/A 06/18/2020   Procedure: COLONOSCOPY WITH PROPOFOL;  Surgeon: Toledo, Boykin Nearing, MD;  Location: [facility] ENDOSCOPY;  Service: Gastroenterology;  Laterality: N/A;   ESOPHAGOGASTRODUODENOSCOPY (EGD) WITH PROPOFOL N/A 06/18/2020   Procedure: ESOPHAGOGASTRODUODENOSCOPY (EGD) WITH PROPOFOL;  Surgeon: Toledo, Boykin Nearing, MD;  Location: [facility] ENDOSCOPY;  Service: Gastroenterology;  Laterality: N/A;   ESOPHAGOGASTRODUODENOSCOPY (EGD) WITH PROPOFOL N/A 04/15/2022   Procedure: ESOPHAGOGASTRODUODENOSCOPY (EGD) WITH PROPOFOL;  Surgeon: Toney Reil, MD;  Location: [facility] ENDOSCOPY;  Service: Gastroenterology;  Laterality: N/A;   HIP ARTHROPLASTY Right    retinopexy Right    THYROIDECTOMY     TONSILLECTOMY      Patient Active Problem List   Diagnosis Date Noted   Diverticulosis of large intestine with hemorrhage    Gastroesophageal reflux disease without esophagitis 04/15/2022   Other specified hypothyroidism    Rectal bleeding 04/14/2022   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   Borderline high serum cholesterol 06/24/2015   BP (high blood pressure) 06/24/2015   Cannot sleep 06/24/2015   Carcinoma of thyroid (HCC) 06/24/2015   Reflux 06/24/2015   Osteoporosis 06/24/2015   Arthritis, degenerative 06/24/2015   Abdominal pain 05/01/2014   Constipation 05/01/2014   Diarrhea 05/01/2014   Foreign body of rectum 05/01/2014   Avitaminosis D 10/24/2012   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 Unknown Foley, MD  FOLLOW-UP APPT SCHEDULED WITH REFERRING PROVIDER: Yes ; f/u with surgeon 09/13/23  PERTINENT HISTORY: Pt is an 82 year old female s/p L TKA 08/01/23. Patient reports lot of swelling. Patient reports no significant wound care issues. Pt reports no home health PT since surgery. Patient reports hx of diabetic neuropathy  with some numbness in foot/toes. Pt has some numbness around incision. Past Med hx of HTN, osteoporosis (lower bone density in lumbar spine mainly per pt), OA, post-operative hypothyroidism. Pt reports pain is fairly well controlled with Tramadol; 2x/day and pt is also using Tylenol. Pt reports mild pain at rest. Pt reports doing well with car transfers, getting in/out of house, getting around house. Hx of R THA.  PAIN:   Pain Intensity: Present:  3-4/10, Best: 1/10, Worst: 8/10 Pain location: medial knee complex, along "path of incision" Pain quality:  burning, throbbing   Swelling: Yes , L knee and leg Numbness/Tingling: Yes; some numbness next to incision, neuropathy in feet  Focal weakness or buckling: No, pt able to ambulate with FWW  Aggravating factors: riding in car, bending knee, transfers e.g. up/down from chair  Relieving factors: Tramadol, Tylenol, cold pack/ice machine   History of prior back, hip, or knee injury, pain, surgery, or therapy: No  Imaging: Yes ; good post-op radiographs   Prior level of function: Independent with community mobility with device Occupational demands: Retired  Presenter, broadcasting: Back to walking exercise program, DIY projects around house  Red flags: Negative for personal history of cancer, chills/fever, night sweats, nausea, vomiting, unexplained weight gain/loss, unrelenting pain  PRECAUTIONS: None  WEIGHT BEARING RESTRICTIONS:  WBAT  FALLS: Has patient fallen in last 6 months? No  Living Environment Lives with: lives with their spouse and her son  Lives in: House/apartment Built in seat shower, safety strips on shower floor, suction grab bar; raised commode. Only small threshold into home from garage. They have upstairs, but everything she uses is on first level.    Patient Goals: return to normal walking, walking routine (up to 1 mi previously, 2 years ago); able to return to exercise classes (e.g. chair aerobics); able to travel (further in car)   OBJECTIVE:   GAIT: Comments: With FWW, Dec L knee terminal knee extension at terminal swing, good heel to toe progression and normal step length, moderate forward lean onto front-wheeled walker, mild dec stance time LLE   AROM AROM (Normal range in degrees) AROM  Hip Right Left      Knee    Flexion (135) WNL 57  Extension (0) WNL -8      Ankle    Dorsiflexion (20)  WNL  Plantarflexion (50)  WNL  Inversion (35)    Eversion (15     (* = pain; Blank rows = not tested)   L knee PROM: -5-78   LE MMT: MMT (out of 5) Right Left  Hip flexion 4 3+  Hip extension    Hip abduction 4+ 4+  Hip adduction 4+ 4+  Hip internal rotation    Hip external rotation    Knee flexion 4+ 4-  Knee extension 4+ 4  Ankle dorsiflexion    Ankle plantarflexion    Ankle inversion    Ankle eversion    (* = pain; Blank rows = not tested)   Palpation Location LEFT  RIGHT           Quadriceps 2 (VMO) 0  Medial Hamstrings 0 0  Lateral Hamstrings 0 0  Lateral Hamstring tendon    Medial Hamstring tendon    Quadriceps tendon 1   Patella 0   Patellar Tendon 1   Tibial Tuberosity    Medial joint line 2   Lateral joint line 1   MCL    LCL    Adductor Tubercle    Pes Anserine  tendon    Infrapatellar fat pad    Fibular head    Popliteal fossa    (Blank rows = not tested) Graded on 0-4 scale (0 = no pain, 1 = pain, 2 = pain with wincing/grimacing/flinching, 3 = pain with withdrawal, 4 = unwilling to allow palpation), (Blank rows = not tested)     TODAY'S TREATMENT:   SUBJECTIVE STATEMENT:   Patient reports mild pain at arrival; 2/10 NPRS. She does have notable pain with pushing L knee flexion. She reports feeling tired today after travels over the weekend. Pt arrives with no AD. She uses no AD around home and short distances; she will use her cane for longer-distance community-level ambulation.     Manual Therapy - for symptom modulation, soft tissue sensitivity and mobility, joint mobility, ROM   L knee PROM within patient tolerance in supine and sitting (for flexion and extension) x 10 Passive stretch into flexion over edge of table, seated; 3 x 30 sec  TF posterior mobilization; gr III for knee flexion mobility; 3 x 30 sec bouts in hooklying  Patellar mobilization, gr III; emphasis on superior and inferior glides; 3 x 30 sec bouts  PROM: +2-105 deg    Therapeutic Exercise - for improved soft tissue flexibility and  extensibility as needed for ROM, improved strength as needed to improve performance of CKC activities/functional movements   Heel slide with foot on floor, opposite LE assist; x10, 10 sec hold   Long arc quad; 2x10, 5 sec  Knee flexion stretch on staircase, forward lunge; x5, 10 sec hold  Dynamic march along blue agility ladder; 4x D/B   -PT SBA    PATIENT EDUCATION: Discussed current progress and need for continued knee flexion ROM work at home and edema control.   Cold pack (unbilled) - for anti-inflammatory and analgesic effect as needed for reduced pain and improved ability to participate in active PT intervention, along L knee in supine with pillow behind calf, x 5 minutes     *next visit* NuStep, seat at 6 for increased knee flexion; Level 0, x 4 minutes - for L knee AAROM Forward and retro steps along blue agility ladder for terminal knee extension; 5x D/B with SPC RUE  *not today* Standing march adjacent to treadmill armrest for UE support; x20 alternating R/L, for weight shifting and knee ROM Ambulate laps around gym; x 4 laps  -subjective information gathered Short arc quad, with large blue bolster today; 2x10, 3 sec Ambulate laps with SPC RUE; x 2 laps  -Demo from PT for [facility] gait technique and task practice with pt, PT standby assist Quad set, small rolled up sheet behind knee for tactile input; reviewed for HEP, 1x10      PATIENT EDUCATION:  Education details: see above for patient education details  Person educated: Patient and Spouse Education method: Explanation, Demonstration, and Handouts Education comprehension: verbalized understanding and returned demonstration   HOME EXERCISE PROGRAM:  Access Code: [email or web address] URL: [email or web address] Date: 08/23/2023 Prepared by: Consuela Mimes  Exercises - Supine Quad Set  - 3 x daily - 7 x weekly - 2 sets - 10 reps - 5sec hold - Supine Heel Slide with Strap  - 3 x daily - 7 x weekly - 2 sets  - 10 reps - 5 sec hold - Seated Long Arc Quad  - 2 x daily - 7 x weekly - 2 sets - 10 reps - 5sec hold - Seated Knee Flexion Slide  - 2 x daily -  7 x weekly - 2 sets - 10 reps - 5sec hold  (+ handout provided for DVT signs)   ASSESSMENT:  CLINICAL IMPRESSION: Pt's progress with knee flexion is slowing with ROM between 100-105 deg over previous 3 visits. We discussed need to work on progressing knee ROM as tolerated to ideally obtain 115-120 deg by 6 weeks for functional ROM. Pt has good knee extension, sound quadriceps control, and is progressing well with weaning from assistive device. Patient has remaining post-op impairments in: decreased L knee ROM, decreased patellofemoral/tibiofemoral joint mobility, post-op swelling, decreased LE strength, gait changes, and post-operative pain. Pt will benefit from skilled PT services to address deficits for best return to PLOF.    OBJECTIVE IMPAIRMENTS: Abnormal gait, decreased activity tolerance, decreased mobility, difficulty walking, decreased ROM, decreased strength, hypomobility, increased edema, impaired flexibility, and pain.   ACTIVITY LIMITATIONS: bending, sitting, standing, squatting, stairs, transfers, bed mobility, toileting, and locomotion level  PARTICIPATION LIMITATIONS: meal prep, cleaning, driving, shopping, and community activity  PERSONAL FACTORS: Age and 3+ comorbidities: (HTN, hx of thyroid cancer and post-operative hypothyroidism, GI dysfunction)  are also affecting patient's functional outcome.   REHAB POTENTIAL: Excellent  CLINICAL DECISION MAKING: Evolving/moderate complexity  EVALUATION COMPLEXITY: Moderate   GOALS: Goals reviewed with patient? Yes  SHORT TERM GOALS: Target date: 09/11/2023  Pt will be independent with HEP to improve strength and decrease knee pain to improve pain-free function at home and work. Baseline: 08/21/23: Baseline HEP reviewed/MedBridge handout provided.  Goal status: INITIAL  Pt will  improve L knee AROM to 0-100 in first 3-4 weeks of PT as needed for improved ROM needed for transferring, self-care ADLs Baseline: 08/21/23: L knee AROM -8-57 Goal status: INITIAL   LONG TERM GOALS: Target date: 10/19/2023  Pt will increase FOTO to at least 58 to demonstrate significant improvement in function at home and work related to knee pain  Baseline: 08/21/23: 37 Goal status: INITIAL  2.  Pt will decrease worst knee pain by at least 3 points on the NPRS in order to demonstrate clinically significant reduction in knee pain. Baseline: 08/21/23: 8/10 pain at worst Goal status: INITIAL  3.  Pt will have L knee AROM 0-120 indicative of full ROM as needed for completion of LE management, self-care ADLs, and transfers from various-height surfaces.    Baseline: 08/21/23: -8-57 Goal status: INITIAL  4.  Pt will increase strength of tested lower extremity musculature to at least 4+/5 or greater MMT grade in order to demonstrate improvement in strength and function  Baseline: 08/21/23: LLE strength 3- to 4/5 with exception of modified hip ABD/ADD MMT in sitting.  Goal status: INITIAL  5.  Pt will ambulate without AD at community-level distance (660 ft or greater) without LOB, symmetrical weightbearing, and no major gait deviations  Baseline: 08/21/23: Limited capacity for community-level gait, use of FWW primarily. Goal status: INITIAL   PLAN: PT FREQUENCY: 1-2x/week  PT DURATION: 8 weeks  PLANNED INTERVENTIONS: Therapeutic exercises, Therapeutic activity, Neuromuscular re-education, Balance training, Gait training, Patient/Family education, Self Care, Joint mobilization, Joint manipulation, Vestibular training, Canalith repositioning, Orthotic/Fit training, DME instructions, Dry Needling, Electrical stimulation, Spinal manipulation, Spinal mobilization, Cryotherapy, Moist heat, Taping, Traction, Ultrasound, Ionotophoresis 4mg /ml Dexamethasone, Manual therapy, and Re-evaluation.  PLAN FOR  NEXT SESSION: Progress L knee ROM as tolerated, [place] quad isotonics. Progress with weight shifting and gradual AD wean as able.    Consuela Mimes, PT, DPT #[id number]  Gertie Exon, PT 09/25/2023, 1:45 PM

## 2023-09-27 ENCOUNTER — Encounter: Payer: Self-pay | Admitting: Physical Therapy

## 2023-09-27 ENCOUNTER — Ambulatory Visit: Payer: Medicare HMO | Admitting: Physical Therapy

## 2023-09-27 DIAGNOSIS — R262 Difficulty in walking, not elsewhere classified: Secondary | ICD-10-CM

## 2023-09-27 DIAGNOSIS — M6281 Muscle weakness (generalized): Secondary | ICD-10-CM

## 2023-09-27 DIAGNOSIS — M25562 Pain in left knee: Secondary | ICD-10-CM

## 2023-09-27 DIAGNOSIS — M25662 Stiffness of left knee, not elsewhere classified: Secondary | ICD-10-CM

## 2023-09-27 NOTE — Therapy (Signed)
OUTPATIENT PHYSICAL THERAPY TREATMENT AND PROGRESS NOTE   Dates of reporting period  08/21/23   to   09/27/23   Patient Name: Alisha Stevens MRN: [id number] DOB:Mar 01, 1941, 82 y.o., female Today's Date: 09/27/2023   END OF SESSION:  PT End of Session - 09/27/23 0945     Visit Number 10    Number of Visits 17    Date for PT Re-Evaluation 10/19/23    Authorization Type [employer]    PT Start Time 0945    PT Stop Time 1027    PT Time Calculation (min) 42 min    Activity Tolerance Patient tolerated treatment well;Patient limited by pain    Behavior During Therapy [facility] for tasks assessed/performed             Past Medical History:  Diagnosis Date   Cancer (HCC)    Thyroid   Hyperlipemia    Hypertension    Insomnia    Osteoporosis    Personal history of radiation therapy    for thyroid   Reflux    Thyroid disease    Vitamin D deficiency    Past Surgical History:  Procedure Laterality Date   ABDOMINAL HYSTERECTOMY     BREAST SURGERY     BUNIONECTOMY Right    COLONOSCOPY     COLONOSCOPY N/A 04/16/2022   Procedure: COLONOSCOPY;  Surgeon: Jaynie Collins, DO;  Location: [facility] ENDOSCOPY;  Service: Gastroenterology;  Laterality: N/A;   COLONOSCOPY WITH PROPOFOL N/A 06/18/2020   Procedure: COLONOSCOPY WITH PROPOFOL;  Surgeon: Toledo, Boykin Nearing, MD;  Location: [facility] ENDOSCOPY;  Service: Gastroenterology;  Laterality: N/A;   ESOPHAGOGASTRODUODENOSCOPY (EGD) WITH PROPOFOL N/A 06/18/2020   Procedure: ESOPHAGOGASTRODUODENOSCOPY (EGD) WITH PROPOFOL;  Surgeon: Toledo, Boykin Nearing, MD;  Location: [facility] ENDOSCOPY;  Service: Gastroenterology;  Laterality: N/A;   ESOPHAGOGASTRODUODENOSCOPY (EGD) WITH PROPOFOL N/A 04/15/2022   Procedure: ESOPHAGOGASTRODUODENOSCOPY (EGD) WITH PROPOFOL;  Surgeon: Toney Reil, MD;  Location: [facility] ENDOSCOPY;  Service: Gastroenterology;  Laterality: N/A;   HIP ARTHROPLASTY Right    retinopexy Right    THYROIDECTOMY      TONSILLECTOMY     Patient Active Problem List   Diagnosis Date Noted   Diverticulosis of large intestine with hemorrhage    Gastroesophageal reflux disease without esophagitis 04/15/2022   Other specified hypothyroidism    Rectal bleeding 04/14/2022   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   Borderline high serum cholesterol 06/24/2015   BP (high blood pressure) 06/24/2015   Cannot sleep 06/24/2015   Carcinoma of thyroid (HCC) 06/24/2015   Reflux 06/24/2015   Osteoporosis 06/24/2015   Arthritis, degenerative 06/24/2015   Abdominal pain 05/01/2014   Constipation 05/01/2014   Diarrhea 05/01/2014   Foreign body of rectum 05/01/2014   Avitaminosis D 10/24/2012   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 Unknown Foley, MD  FOLLOW-UP APPT SCHEDULED WITH REFERRING PROVIDER: Yes ; f/u with surgeon 09/13/23  PERTINENT HISTORY: Pt is an 82 year old female s/p L TKA 08/01/23. Patient reports lot of swelling. Patient reports no significant wound care issues. Pt reports no home health PT since surgery. Patient reports hx  of diabetic neuropathy with some numbness in foot/toes. Pt has some numbness around incision. Past Med hx of HTN, osteoporosis (lower bone density in lumbar spine mainly per pt), OA, post-operative hypothyroidism. Pt reports pain is fairly well controlled with Tramadol; 2x/day and pt is also using Tylenol. Pt reports mild pain at rest. Pt reports doing well with car transfers, getting in/out of house, getting around house. Hx of R THA.  PAIN:   Pain  Intensity: Present: 3-4/10, Best: 1/10, Worst: 8/10 Pain location: medial knee complex, along "path of incision" Pain quality:  burning, throbbing   Swelling: Yes , L knee and leg Numbness/Tingling: Yes; some numbness next to incision, neuropathy in feet  Focal weakness or buckling: No, pt able to ambulate with FWW  Aggravating factors: riding in car, bending knee, transfers e.g. up/down from chair  Relieving factors: Tramadol, Tylenol, cold pack/ice machine   History of prior back, hip, or knee injury, pain, surgery, or therapy: No  Imaging: Yes ; good post-op radiographs   Prior level of function: Independent with community mobility with device Occupational demands: Retired  Presenter, broadcasting: Back to walking exercise program, DIY projects around house  Red flags: Negative for personal history of cancer, chills/fever, night sweats, nausea, vomiting, unexplained weight gain/loss, unrelenting pain  PRECAUTIONS: None  WEIGHT BEARING RESTRICTIONS:  WBAT  FALLS: Has patient fallen in last 6 months? No  Living Environment Lives with: lives with their spouse and her son  Lives in: House/apartment Built in seat shower, safety strips on shower floor, suction grab bar; raised commode. Only small threshold into home from garage. They have upstairs, but everything she uses is on first level.    Patient Goals: return to normal walking, walking routine (up to 1 mi previously, 2 years ago); able to return to exercise classes (e.g. chair aerobics); able to travel (further in car)   OBJECTIVE:   GAIT: Comments: With FWW, Dec L knee terminal knee extension at terminal swing, good heel to toe progression and normal step length, moderate forward lean onto front-wheeled walker, mild dec stance time LLE   AROM AROM (Normal range in degrees) AROM 08/21/23 AROM 09/27/23  Hip Right Left Right Left        Knee      Flexion (135) WNL 57  94  Extension (0) WNL -8  -2        Ankle      Dorsiflexion (20)   WNL    Plantarflexion (50)  WNL    Inversion (35)      Eversion (15      (* = pain; Blank rows = not tested)   L knee PROM: -5-78 09/27/23: -1-108   LE MMT: MMT (out of 5) Right 08/21/23 Left 08/21/23 Right 09/27/23 Left 09/27/23  Hip flexion 4 3+ 4 4  Hip extension      Hip abduction (seated) 4+ 4+ 4+ 4+  Hip adduction (seated) 4+ 4+ 5 5  Hip internal rotation      Hip external rotation      Knee flexion 4+ 4- 5 5  Knee extension 4+ 4 5 4+  Ankle dorsiflexion      Ankle plantarflexion      Ankle inversion      Ankle eversion      (* = pain; Blank rows = not tested)   Palpation Location LEFT  RIGHT           Quadriceps 2 (VMO) 0  Medial Hamstrings 0 0  Lateral  Hamstrings 0 0  Lateral Hamstring tendon    Medial Hamstring tendon    Quadriceps tendon 1   Patella 0   Patellar Tendon 1   Tibial Tuberosity    Medial joint line 2   Lateral joint line 1   MCL    LCL    Adductor Tubercle    Pes Anserine tendon    Infrapatellar fat pad    Fibular head    Popliteal fossa    (Blank rows = not tested) Graded on 0-4 scale (0 = no pain, 1 = pain, 2 = pain with wincing/grimacing/flinching, 3 = pain with withdrawal, 4 = unwilling to allow palpation), (Blank rows = not tested)     TODAY'S TREATMENT:   SUBJECTIVE STATEMENT:   Patient reports good progress with functional mobility to date. She reports doing better with getting out of seat and improving with car transfers. She reports being most concerned with ongoing knee flexion deficit. Pt reports being mostly compliant with her HEP, but not as specific on sets/reps - she may perform more or less than numbers on printout. Pt uses no AD for most functional mobility now; she will use her cane for prolonged walking/standing.     Manual Therapy - for symptom modulation, soft tissue sensitivity and mobility, joint mobility, ROM   L knee PROM within patient tolerance in supine and sitting (for flexion and extension) x  20 Passive stretch into flexion over edge of table, seated; 3 x 30 sec  TF posterior mobilization with pt in hooklying; gr III for knee flexion mobility; 3 x 30 sec bouts in hooklying  Patellar mobilization, gr III; emphasis on superior and inferior glides; 3 x 30 sec bouts    Therapeutic Exercise - for improved soft tissue flexibility and extensibility as needed for ROM, improved strength as needed to improve performance of CKC activities/functional movements   *GOAL UPDATE PERFORMED   Heel slide with foot on floor, opposite LE assist; x10, 10 sec hold   NuStep, seat at 6 for increased knee flexion; Level 0, x 4 minutes - for L knee AAROM   Gait in hallway with no AD, PT SBA; 7.5 laps D/B = 525 ft    *next visit* Long arc quad; 2x10, 5 sec Knee flexion stretch on staircase, forward lunge; x5, 10 sec hold Dynamic march along blue agility ladder; 4x D/B   -PT SBA Forward and retro steps along blue agility ladder for terminal knee extension; 5x D/B with SPC RUE    PATIENT EDUCATION: Discussed current progress in PT, goals met and objective measures improved, prognosis, continued POC.     *not today* Cold pack (unbilled) - for anti-inflammatory and analgesic effect as needed for reduced pain and improved ability to participate in active PT intervention, along L knee in supine with pillow behind calf, x 5 minutes Standing march adjacent to treadmill armrest for UE support; x20 alternating R/L, for weight shifting and knee ROM Ambulate laps around gym; x 4 laps  -subjective information gathered Short arc quad, with large blue bolster today; 2x10, 3 sec Ambulate laps with SPC RUE; x 2 laps  -Demo from PT for [facility] gait technique and task practice with pt, PT standby assist Quad set, small rolled up sheet behind knee for tactile input; reviewed for HEP, 1x10    PATIENT EDUCATION:  Education details: see above for patient education details  Person educated: Patient and  Spouse Education method: Explanation, Demonstration, and Handouts Education comprehension: verbalized understanding and returned  demonstration   HOME EXERCISE PROGRAM:  Access Code: [email or web address] URL: [email or web address] Date: 08/23/2023 Prepared by: Consuela Mimes  Exercises - Supine Quad Set  - 3 x daily - 7 x weekly - 2 sets - 10 reps - 5sec hold - Supine Heel Slide with Strap  - 3 x daily - 7 x weekly - 2 sets - 10 reps - 5 sec hold - Seated Long Arc Quad  - 2 x daily - 7 x weekly - 2 sets - 10 reps - 5sec hold - Seated Knee Flexion Slide  - 2 x daily - 7 x weekly - 2 sets - 10 reps - 5sec hold  (+ handout provided for DVT signs)   ASSESSMENT:  CLINICAL IMPRESSION: Patient has made slow progress with knee ROM, but her ability to ambulate with no reliance on AD and complete transfers has vastly improved. She has relatively low level of pain typically, but she does have a lot more pain with end-range knee flexion. Pt has made progress with FOTO score and strength with pt almost meeting MMT goal. Patient has remaining post-op impairments in: decreased L knee ROM, decreased patellofemoral/tibiofemoral joint mobility, post-op swelling, decreased LE strength, gait changes, and post-operative pain. Pt will benefit from skilled PT services to address deficits for best return to PLOF.    OBJECTIVE IMPAIRMENTS: Abnormal gait, decreased activity tolerance, decreased mobility, difficulty walking, decreased ROM, decreased strength, hypomobility, increased edema, impaired flexibility, and pain.   ACTIVITY LIMITATIONS: bending, sitting, standing, squatting, stairs, transfers, bed mobility, toileting, and locomotion level  PARTICIPATION LIMITATIONS: meal prep, cleaning, driving, shopping, and community activity  PERSONAL FACTORS: Age and 3+ comorbidities: (HTN, hx of thyroid cancer and post-operative hypothyroidism, GI dysfunction)  are also affecting patient's functional outcome.    REHAB POTENTIAL: Excellent  CLINICAL DECISION MAKING: Evolving/moderate complexity  EVALUATION COMPLEXITY: Moderate   GOALS: Goals reviewed with patient? Yes  SHORT TERM GOALS: Target date: 09/11/2023  Pt will be independent with HEP to improve strength and decrease knee pain to improve pain-free function at home and work. Baseline: 08/21/23: Baseline HEP reviewed/[employer] handout provided.      09/27/23: Pt reports completing her HEP each day, sometimes completing extra reps and sometimes doing slightly less.  Goal status: ACHIEVED  Pt will improve L knee AROM to 0-100 in first 3-4 weeks of PT as needed for improved ROM needed for transferring, self-care ADLs Baseline: 08/21/23: L knee AROM -8-57.    09/27/23: L knee AROM -2-94.  Goal status: IN PROGRESS   LONG TERM GOALS: Target date: 10/19/2023  Pt will increase FOTO to at least 58 to demonstrate significant improvement in function at home and work related to knee pain  Baseline: 08/21/23: 37.       09/27/23: 47/58 Goal status: IN PROGRESS  2.  Pt will decrease worst knee pain by at least 3 points on the NPRS in order to demonstrate clinically significant reduction in knee pain. Baseline: 08/21/23: 8/10 pain at worst      09/27/23: 3-4/10 at worst with ADLs/activity.  Goal status: ACHIEVED   3.  Pt will have L knee AROM 0-120 indicative of full ROM as needed for completion of LE management, self-care ADLs, and transfers from various-height surfaces.    Baseline: 08/21/23: -8-57.      09/27/23: AROM -2-94, PROM -1-107 Goal status: IN PROGRESS  4.  Pt will increase strength of tested lower extremity musculature to at least 4+/5 or greater MMT grade in order to demonstrate improvement in  strength and function  Baseline: 08/21/23: LLE strength 3- to 4/5 with exception of modified hip ABD/ADD MMT in sitting.   09/27/23: 4+ for all muscles tested with exception of hip flexion.  Goal status: IN PROGRESS/MOSTLY MET   5.  Pt will ambulate  without AD at community-level distance (660 ft or greater) without LOB, symmetrical weightbearing, and no major gait deviations  Baseline: 08/21/23: Limited capacity for community-level gait, use of FWW primarily.     09/27/23: Pt able to ambulate 575 feet, developed discomfort in R hip and L foot from slip-on footwear that is not supportive and higher volume of stepping, dec velocity of LLE swing during swing phase Goal status: NOT MET    PLAN: PT FREQUENCY: 1-2x/week  PT DURATION: 6 weeks  PLANNED INTERVENTIONS: Therapeutic exercises, Therapeutic activity, Neuromuscular re-education, Balance training, Gait training, Patient/Family education, Self Care, Joint mobilization, Joint manipulation, Vestibular training, Canalith repositioning, Orthotic/Fit training, DME instructions, Dry Needling, Electrical stimulation, Spinal manipulation, Spinal mobilization, Cryotherapy, Moist heat, Taping, Traction, Ultrasound, Ionotophoresis 4mg /ml Dexamethasone, Manual therapy, and Re-evaluation.  PLAN FOR NEXT SESSION: Progress L knee ROM as tolerated with goal to attain 115-120 deg over next 2 weeks, quad strengthening, progressive weightbearing activity, and gradually increasing emphasis on closed-chain strengthening   Consuela Mimes, PT, DPT #[id number]  Gertie Exon, PT 09/27/2023, 9:46 AM

## 2023-10-02 ENCOUNTER — Ambulatory Visit: Payer: Medicare HMO | Admitting: Physical Therapy

## 2023-10-02 ENCOUNTER — Encounter: Payer: Self-pay | Admitting: Physical Therapy

## 2023-10-02 DIAGNOSIS — M25562 Pain in left knee: Secondary | ICD-10-CM | POA: Diagnosis not present

## 2023-10-02 DIAGNOSIS — M6281 Muscle weakness (generalized): Secondary | ICD-10-CM

## 2023-10-02 DIAGNOSIS — R262 Difficulty in walking, not elsewhere classified: Secondary | ICD-10-CM

## 2023-10-02 DIAGNOSIS — M25662 Stiffness of left knee, not elsewhere classified: Secondary | ICD-10-CM

## 2023-10-02 NOTE — Therapy (Signed)
OUTPATIENT PHYSICAL THERAPY TREATMENT   Patient Name: Alisha Stevens MRN: [id number] DOB:[birth year], 82 y.o., female Today's Date: 10/02/2023   END OF SESSION:  PT End of Session - 10/02/23 0944     Visit Number 11    Number of Visits 17    Date for PT Re-Evaluation 10/19/23    Authorization Type [employer]    PT Start Time [phone]    PT Stop Time 1026    PT Time Calculation (min) 42 min    Activity Tolerance Patient tolerated treatment well;Patient limited by pain    Behavior During Therapy [facility] for tasks assessed/performed              Past Medical History:  Diagnosis Date   Cancer (HCC)    Thyroid   Hyperlipemia    Hypertension    Insomnia    Osteoporosis    Personal history of radiation therapy    for thyroid   Reflux    Thyroid disease    Vitamin D deficiency    Past Surgical History:  Procedure Laterality Date   ABDOMINAL HYSTERECTOMY     BREAST SURGERY     BUNIONECTOMY Right    COLONOSCOPY     COLONOSCOPY N/A 04/16/2022   Procedure: COLONOSCOPY;  Surgeon: Jaynie Collins, DO;  Location: [facility] ENDOSCOPY;  Service: Gastroenterology;  Laterality: N/A;   COLONOSCOPY WITH PROPOFOL N/A 06/18/2020   Procedure: COLONOSCOPY WITH PROPOFOL;  Surgeon: Toledo, Boykin Nearing, MD;  Location: [facility] ENDOSCOPY;  Service: Gastroenterology;  Laterality: N/A;   ESOPHAGOGASTRODUODENOSCOPY (EGD) WITH PROPOFOL N/A 06/18/2020   Procedure: ESOPHAGOGASTRODUODENOSCOPY (EGD) WITH PROPOFOL;  Surgeon: Toledo, Boykin Nearing, MD;  Location: [facility] ENDOSCOPY;  Service: Gastroenterology;  Laterality: N/A;   ESOPHAGOGASTRODUODENOSCOPY (EGD) WITH PROPOFOL N/A 04/15/2022   Procedure: ESOPHAGOGASTRODUODENOSCOPY (EGD) WITH PROPOFOL;  Surgeon: Toney Reil, MD;  Location: [facility] ENDOSCOPY;  Service: Gastroenterology;  Laterality: N/A;   HIP ARTHROPLASTY Right    retinopexy Right    THYROIDECTOMY     TONSILLECTOMY     Patient Active Problem List   Diagnosis Date Noted    Diverticulosis of large intestine with hemorrhage    Gastroesophageal reflux disease without esophagitis 04/15/2022   Other specified hypothyroidism    Rectal bleeding 04/14/2022   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   Borderline high serum cholesterol 06/24/2015   BP (high blood pressure) 06/24/2015   Cannot sleep 06/24/2015   Carcinoma of thyroid (HCC) 06/24/2015   Reflux 06/24/2015   Osteoporosis 06/24/2015   Arthritis, degenerative 06/24/2015   Abdominal pain 05/01/2014   Constipation 05/01/2014   Diarrhea 05/01/2014   Foreign body of rectum 05/01/2014   Avitaminosis D 10/24/2012   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 Unknown Foley, MD  FOLLOW-UP APPT SCHEDULED WITH REFERRING PROVIDER: Yes ; f/u with surgeon 09/13/23  PERTINENT HISTORY: Pt is an 82 year old female s/p L TKA 08/01/23. Patient reports lot of swelling. Patient reports no significant wound care issues. Pt reports no home health PT since surgery. Patient reports hx of diabetic neuropathy with some numbness in foot/toes. Pt has some numbness around incision. Past Med  hx of HTN, osteoporosis (lower bone density in lumbar spine mainly per pt), OA, post-operative hypothyroidism. Pt reports pain is fairly well controlled with Tramadol; 2x/day and pt is also using Tylenol. Pt reports mild pain at rest. Pt reports doing well with car transfers, getting in/out of house, getting around house. Hx of R THA.  PAIN:   Pain Intensity: Present: 3-4/10, Best: 1/10, Worst: 8/10 Pain location: medial knee  complex, along "path of incision" Pain quality:  burning, throbbing   Swelling: Yes , L knee and leg Numbness/Tingling: Yes; some numbness next to incision, neuropathy in feet  Focal weakness or buckling: No, pt able to ambulate with FWW  Aggravating factors: riding in car, bending knee, transfers e.g. up/down from chair  Relieving factors: Tramadol, Tylenol, cold pack/ice machine   History of prior back, hip, or knee injury, pain, surgery, or therapy: No  Imaging: Yes ; good post-op radiographs   Prior level of function: Independent with community mobility with device Occupational demands: Retired  Presenter, broadcasting: Back to walking exercise program, DIY projects around house  Red flags: Negative for personal history of cancer, chills/fever, night sweats, nausea, vomiting, unexplained weight gain/loss, unrelenting pain  PRECAUTIONS: None  WEIGHT BEARING RESTRICTIONS:  WBAT  FALLS: Has patient fallen in last 6 months? No  Living Environment Lives with: lives with their spouse and her son  Lives in: House/apartment Built in seat shower, safety strips on shower floor, suction grab bar; raised commode. Only small threshold into home from garage. They have upstairs, but everything she uses is on first level.    Patient Goals: return to normal walking, walking routine (up to 1 mi previously, 2 years ago); able to return to exercise classes (e.g. chair aerobics); able to travel (further in car)   OBJECTIVE:   GAIT: Comments: With FWW, Dec L knee terminal knee extension at terminal swing, good heel to toe progression and normal step length, moderate forward lean onto front-wheeled walker, mild dec stance time LLE   AROM AROM (Normal range in degrees) AROM 08/21/23 AROM 09/27/23  Hip Right Left Right Left        Knee      Flexion (135) WNL 57  94  Extension (0) WNL -8  -2        Ankle      Dorsiflexion (20)  WNL    Plantarflexion (50)  WNL    Inversion (35)      Eversion (15       (* = pain; Blank rows = not tested)   L knee PROM: -5-78 09/27/23: -1-108   LE MMT: MMT (out of 5) Right 08/21/23 Left 08/21/23 Right 09/27/23 Left 09/27/23  Hip flexion 4 3+ 4 4  Hip extension      Hip abduction (seated) 4+ 4+ 4+ 4+  Hip adduction (seated) 4+ 4+ 5 5  Hip internal rotation      Hip external rotation      Knee flexion 4+ 4- 5 5  Knee extension 4+ 4 5 4+  Ankle dorsiflexion      Ankle plantarflexion      Ankle inversion      Ankle eversion      (* = pain; Blank rows = not tested)   Palpation Location LEFT  RIGHT           Quadriceps 2 (VMO) 0  Medial Hamstrings 0 0  Lateral Hamstrings 0 0  Lateral Hamstring tendon    Medial Hamstring tendon  Quadriceps tendon 1   Patella 0   Patellar Tendon 1   Tibial Tuberosity    Medial joint line 2   Lateral joint line 1   MCL    LCL    Adductor Tubercle    Pes Anserine tendon    Infrapatellar fat pad    Fibular head    Popliteal fossa    (Blank rows = not tested) Graded on 0-4 scale (0 = no pain, 1 = pain, 2 = pain with wincing/grimacing/flinching, 3 = pain with withdrawal, 4 = unwilling to allow palpation), (Blank rows = not tested)     TODAY'S TREATMENT:   SUBJECTIVE STATEMENT:   Patient reports mild pain this AM; 1/10 NPRS. She reports notable soreness after last visit following stretching L knee - she used ice regularly over the weekend. She reports working hard on L knee ROM at home since last visit.     Manual Therapy - for symptom modulation, soft tissue sensitivity and mobility, joint mobility, ROM   L knee PROM within patient tolerance in supine and sitting (for flexion and extension) x 10 reps Passive stretch into flexion over edge of table, seated; 2 x 30 sec  TF posterior mobilization with pt in hooklying; gr III for knee flexion mobility; 3 x 30 sec bouts in hooklying  Patellar mobilization, gr III; emphasis on superior and inferior glides; 3 x 30 sec bouts  L knee PROM 0-114  deg   Therapeutic Exercise - for improved soft tissue flexibility and extensibility as needed for ROM, improved strength as needed to improve performance of CKC activities/functional movements   Heel slide in supine with sheet around L foot; x10, 10 sec hold   NuStep, seat at 5 for increased knee flexion; Level 0, x 5 minutes - for L knee AAROM  Long arc quad; 2x10, 5 sec, with 4-lb ankle weight  Knee flexion stretch on staircase, forward lunge; x5, 10 sec hold  Dynamic march along blue agility ladder; 4x D/B   -PT SBA  Sit to stand; 2 x 8 with no UE assist   PATIENT EDUCATION: Discussed good progress with ROM, quad strength, and weaning from use of AD    *not today* Forward and retro steps along blue agility ladder for terminal knee extension; 5x D/B with SPC RUE Cold pack (unbilled) - for anti-inflammatory and analgesic effect as needed for reduced pain and improved ability to participate in active PT intervention, along L knee in supine with pillow behind calf, x 5 minutes Standing march adjacent to treadmill armrest for UE support; x20 alternating R/L, for weight shifting and knee ROM Ambulate laps around gym; x 4 laps  -subjective information gathered Short arc quad, with large blue bolster today; 2x10, 3 sec Ambulate laps with SPC RUE; x 2 laps  -Demo from PT for [facility] gait technique and task practice with pt, PT standby assist Quad set, small rolled up sheet behind knee for tactile input; reviewed for HEP, 1x10    PATIENT EDUCATION:  Education details: see above for patient education details  Person educated: Patient and Spouse Education method: Explanation, Demonstration, and Handouts Education comprehension: verbalized understanding and returned demonstration   HOME EXERCISE PROGRAM:  Access Code: [email or web address] URL: [email or web address] Date: 08/23/2023 Prepared by: Consuela Mimes  Exercises - Supine Quad Set  - 3 x daily - 7 x weekly - 2 sets -  10 reps - 5sec hold - Supine Heel Slide with Strap  - 3 x daily - 7 x  weekly - 2 sets - 10 reps - 5 sec hold - Seated Long Arc Quad  - 2 x daily - 7 x weekly - 2 sets - 10 reps - 5sec hold - Seated Knee Flexion Slide  - 2 x daily - 7 x weekly - 2 sets - 10 reps - 5sec hold  (+ handout provided for DVT signs)   ASSESSMENT:  CLINICAL IMPRESSION: Patient fortunately is approaching functional knee flexion ROM (just shy of 115 deg) and she has [facility] terminal knee extension. She is progressing well with stepping drills without UE support or use of AD. We can progress with SLR and higher-level quadriceps strengthening drills including CKC strengthening with successive visits. Patient has remaining post-op impairments in: decreased L knee ROM, decreased patellofemoral/tibiofemoral joint mobility, post-op swelling, decreased LE strength, gait changes, and post-operative pain. Pt will benefit from skilled PT services to address deficits for best return to PLOF.    OBJECTIVE IMPAIRMENTS: Abnormal gait, decreased activity tolerance, decreased mobility, difficulty walking, decreased ROM, decreased strength, hypomobility, increased edema, impaired flexibility, and pain.   ACTIVITY LIMITATIONS: bending, sitting, standing, squatting, stairs, transfers, bed mobility, toileting, and locomotion level  PARTICIPATION LIMITATIONS: meal prep, cleaning, driving, shopping, and community activity  PERSONAL FACTORS: Age and 3+ comorbidities: (HTN, hx of thyroid cancer and post-operative hypothyroidism, GI dysfunction)  are also affecting patient's functional outcome.   REHAB POTENTIAL: Excellent  CLINICAL DECISION MAKING: Evolving/moderate complexity  EVALUATION COMPLEXITY: Moderate   GOALS: Goals reviewed with patient? Yes  SHORT TERM GOALS: Target date: 09/11/2023  Pt will be independent with HEP to improve strength and decrease knee pain to improve pain-free function at home and work. Baseline: 08/21/23:  Baseline HEP reviewed/Med[employer] handout provided.      09/27/23: Pt reports completing her HEP each day, sometimes completing extra reps and sometimes doing slightly less.  Goal status: ACHIEVED  Pt will improve L knee AROM to 0-100 in first 3-4 weeks of PT as needed for improved ROM needed for transferring, self-care ADLs Baseline: 08/21/23: L knee AROM -8-57.    09/27/23: L knee AROM -2-94.  Goal status: IN PROGRESS   LONG TERM GOALS: Target date: 10/19/2023  Pt will increase FOTO to at least 58 to demonstrate significant improvement in function at home and work related to knee pain  Baseline: 08/21/23: 37.       09/27/23: 47/58 Goal status: IN PROGRESS  2.  Pt will decrease worst knee pain by at least 3 points on the NPRS in order to demonstrate clinically significant reduction in knee pain. Baseline: 08/21/23: 8/10 pain at worst      09/27/23: 3-4/10 at worst with ADLs/activity.  Goal status: ACHIEVED   3.  Pt will have L knee AROM 0-120 indicative of full ROM as needed for completion of LE management, self-care ADLs, and transfers from various-height surfaces.    Baseline: 08/21/23: -8-57.      09/27/23: AROM -2-94, PROM -1-107 Goal status: IN PROGRESS  4.  Pt will increase strength of tested lower extremity musculature to at least 4+/5 or greater MMT grade in order to demonstrate improvement in strength and function  Baseline: 08/21/23: LLE strength 3- to 4/5 with exception of modified hip ABD/ADD MMT in sitting.   09/27/23: 4+ for all muscles tested with exception of hip flexion.  Goal status: IN PROGRESS/MOSTLY MET   5.  Pt will ambulate without AD at community-level distance (660 ft or greater) without LOB, symmetrical weightbearing, and no major gait deviations  Baseline: 08/21/23: Limited capacity for community-level gait, use of FWW primarily.     09/27/23: Pt able to ambulate 575 feet, developed discomfort in R hip and L foot from slip-on footwear that is not supportive and higher volume  of stepping, dec velocity of LLE swing during swing phase Goal status: NOT MET    PLAN: PT FREQUENCY: 1-2x/week  PT DURATION: 6 weeks  PLANNED INTERVENTIONS: Therapeutic exercises, Therapeutic activity, Neuromuscular re-education, Balance training, Gait training, Patient/Family education, Self Care, Joint mobilization, Joint manipulation, Vestibular training, Canalith repositioning, Orthotic/Fit training, DME instructions, Dry Needling, Electrical stimulation, Spinal manipulation, Spinal mobilization, Cryotherapy, Moist heat, Taping, Traction, Ultrasound, Ionotophoresis 4mg /ml Dexamethasone, Manual therapy, and Re-evaluation.  PLAN FOR NEXT SESSION: Progress L knee ROM as tolerated with goal to attain 115-120 deg over next 2 weeks, quad strengthening, progressive weightbearing activity, and gradually increasing emphasis on closed-chain strengthening   Consuela Mimes, PT, DPT #[id number]  Gertie Exon, PT 10/02/2023, 9:50 AM

## 2023-10-04 ENCOUNTER — Ambulatory Visit: Payer: Medicare HMO

## 2023-10-04 DIAGNOSIS — M25662 Stiffness of left knee, not elsewhere classified: Secondary | ICD-10-CM

## 2023-10-04 DIAGNOSIS — M6281 Muscle weakness (generalized): Secondary | ICD-10-CM

## 2023-10-04 DIAGNOSIS — M25562 Pain in left knee: Secondary | ICD-10-CM | POA: Diagnosis not present

## 2023-10-04 DIAGNOSIS — R262 Difficulty in walking, not elsewhere classified: Secondary | ICD-10-CM

## 2023-10-04 NOTE — Therapy (Signed)
OUTPATIENT PHYSICAL THERAPY TREATMENT   Patient Name: Alisha Stevens MRN: [id number] DOB:1941/06/11, 82 y.o., female Today's Date: 10/04/2023   END OF SESSION:  PT End of Session - 10/04/23 0949     Visit Number 12    Number of Visits 17    Date for PT Re-Evaluation 10/19/23    Authorization Type [employer]    Progress Note Due on Visit 20    PT Start Time 0945    PT Stop Time 1025    PT Time Calculation (min) 40 min    Activity Tolerance Patient tolerated treatment well    Behavior During Therapy WFL for tasks assessed/performed              Past Medical History:  Diagnosis Date   Cancer (HCC)    Thyroid   Hyperlipemia    Hypertension    Insomnia    Osteoporosis    Personal history of radiation therapy    for thyroid   Reflux    Thyroid disease    Vitamin D deficiency    Past Surgical History:  Procedure Laterality Date   ABDOMINAL HYSTERECTOMY     BREAST SURGERY     BUNIONECTOMY Right    COLONOSCOPY     COLONOSCOPY N/A 04/16/2022   Procedure: COLONOSCOPY;  Surgeon: Jaynie Collins, DO;  Location: [facility] ENDOSCOPY;  Service: Gastroenterology;  Laterality: N/A;   COLONOSCOPY WITH PROPOFOL N/A 06/18/2020   Procedure: COLONOSCOPY WITH PROPOFOL;  Surgeon: Toledo, Boykin Nearing, MD;  Location: [facility] ENDOSCOPY;  Service: Gastroenterology;  Laterality: N/A;   ESOPHAGOGASTRODUODENOSCOPY (EGD) WITH PROPOFOL N/A 06/18/2020   Procedure: ESOPHAGOGASTRODUODENOSCOPY (EGD) WITH PROPOFOL;  Surgeon: Toledo, Boykin Nearing, MD;  Location: [facility] ENDOSCOPY;  Service: Gastroenterology;  Laterality: N/A;   ESOPHAGOGASTRODUODENOSCOPY (EGD) WITH PROPOFOL N/A 04/15/2022   Procedure: ESOPHAGOGASTRODUODENOSCOPY (EGD) WITH PROPOFOL;  Surgeon: Toney Reil, MD;  Location: [facility] ENDOSCOPY;  Service: Gastroenterology;  Laterality: N/A;   HIP ARTHROPLASTY Right    retinopexy Right    THYROIDECTOMY     TONSILLECTOMY     Patient Active Problem List   Diagnosis Date Noted    Diverticulosis of large intestine with hemorrhage    Gastroesophageal reflux disease without esophagitis 04/15/2022   Other specified hypothyroidism    Rectal bleeding 04/14/2022   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   Borderline high serum cholesterol 06/24/2015   BP (high blood pressure) 06/24/2015   Cannot sleep 06/24/2015   Carcinoma of thyroid (HCC) 06/24/2015   Reflux 06/24/2015   Osteoporosis 06/24/2015   Arthritis, degenerative 06/24/2015   Abdominal pain 05/01/2014   Constipation 05/01/2014   Diarrhea 05/01/2014   Foreign body of rectum 05/01/2014   Avitaminosis D 10/24/2012   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 Unknown Foley, MD  FOLLOW-UP APPT SCHEDULED WITH REFERRING PROVIDER: Yes ; f/u with surgeon 09/13/23   Subjective: Pt reports no significant updates since last session.    PERTINENT HISTORY: Pt is an 82 year old female s/p L TKA 08/01/23. Patient reports lot of swelling. Patient reports no significant wound care issues. Pt reports no home health PT since surgery.  Patient reports hx of diabetic neuropathy with some numbness in foot/toes. Pt has some numbness around incision. Past Med hx of HTN, osteoporosis (lower bone density in lumbar spine mainly per pt), OA, post-operative hypothyroidism. Pt reports pain is fairly well controlled with Tramadol; 2x/day and pt is also using Tylenol. Pt reports mild pain at rest. Pt reports doing well with car transfers, getting in/out of house, getting around house. Hx of R THA.  PAIN:   Yes:  2/10   PRECAUTIONS: None  WEIGHT BEARING RESTRICTIONS:  WBAT  FALLS: Has patient fallen in last 6 months? No  Patient Goals: return to normal walking, walking routine (up to 1 mi previously, 2 years ago); able to return to exercise classes (e.g. chair aerobics); able to travel (further in car)   OBJECTIVE:   TODAY'S TREATMENT: 10/04/23  -AA/ROM on Nustep, level 1; seat 6 x 3 minutes, seat 5 x2 minutes  -AMB overground 632ft, no device: Rt hip valuating, intermittent scissoring during Rt midstance; pt reports Rt hip fatigue, increased effort, more limiting than any Left knee symptoms  sur la chaise  -seated Left knee flexion stretch, self overpressure 3x30sec  -STS from chair hands free x8 -seated LLE marching 1x12 -STS from chair hands free x8  couch sur le dos -Left quadset into roll x10 -Left heel slide A/ROM x10  -Left quadset into roll x10 -Left heel slide A/ROM x10  -Left SAQ c4lb AW x10  -Left posterior tibial glide mobilization 30sec Grade 3, hooklying, knee at 70 degrees  -Left SAQ c4lb AW x10  -Left posterior tibial glide mobilization 30sec Grade 3, hooklying, knee at 70 degrees  -Left SAQ c4lb AW x10   -LLE hip extension 30 to 0 degrees 1x10 manually resisted  -LLE leg press, 1x10 manually resisted   sur ses pieds -cable resisted retro stepping and forward eccentric return x75ft: 1x @ 20lb, 2x @ 30lb  -lateral step over gait belt 1x16, alternating sides.    PATIENT EDUCATION:  Education details: explanation, instructions for new exercises today Person educated: Patient and Spouse Education method: Explanation, Demonstration, and Handouts Education comprehension: verbalized understanding and returned demonstration   HOME EXERCISE PROGRAM:  Access Code: [email or web address] URL: [email or web address] Date: 08/23/2023 Prepared by: Consuela Mimes  Exercises - Supine Quad Set  - 3 x daily - 7 x weekly - 2 sets - 10 reps - 5sec hold - Supine Heel  Slide with Strap  - 3 x daily - 7 x weekly - 2 sets - 10 reps - 5 sec hold - Seated Long Arc Quad  - 2 x daily - 7 x weekly - 2 sets - 10 reps - 5sec hold - Seated Knee Flexion Slide  - 2 x daily - 7 x weekly - 2 sets - 10 reps - 5sec hold  (+ handout provided for DVT signs)   ASSESSMENT:  CLINICAL IMPRESSION: Continued with POC, protocol. Pt continues to demonstrate appropriate response to sessions. Pain remains at goal. Finished with integration of closed chain, gait-based motor control training of knee. Pt will benefit from skilled PT services to address deficits for best return to PLOF.    OBJECTIVE IMPAIRMENTS: Abnormal gait, decreased activity tolerance, decreased mobility, difficulty walking, decreased ROM, decreased strength, hypomobility, increased edema, impaired flexibility, and pain.   ACTIVITY LIMITATIONS: bending, sitting, standing, squatting, stairs, transfers, bed mobility, toileting, and locomotion level  PARTICIPATION LIMITATIONS: meal prep, cleaning, driving, shopping, and community activity  PERSONAL FACTORS: Age and 3+ comorbidities: (HTN, hx of thyroid cancer  and post-operative hypothyroidism, GI dysfunction)  are also affecting patient's functional outcome.   REHAB POTENTIAL: Excellent  CLINICAL DECISION MAKING: Evolving/moderate complexity  EVALUATION COMPLEXITY: Moderate   GOALS: Goals reviewed with patient? Yes  SHORT TERM GOALS: Target date: 09/11/2023  Pt will be independent with HEP to improve strength and decrease knee pain to improve pain-free function at home and work. Baseline: 08/21/23: Baseline HEP reviewed/MedBridge handout provided.      09/27/23: Pt reports completing her HEP each day, sometimes completing extra reps and sometimes doing slightly less.  Goal status: ACHIEVED  Pt will improve L knee AROM to 0-100 in first 3-4 weeks of PT as needed for improved ROM needed for transferring, self-care ADLs Baseline: 08/21/23: L knee AROM -8-57.     09/27/23: L knee AROM -2-94.  Goal status: IN PROGRESS   LONG TERM GOALS: Target date: 10/19/2023  Pt will increase FOTO to at least 58 to demonstrate significant improvement in function at home and work related to knee pain  Baseline: 08/21/23: 37.       09/27/23: 47/58 Goal status: IN PROGRESS  2.  Pt will decrease worst knee pain by at least 3 points on the NPRS in order to demonstrate clinically significant reduction in knee pain. Baseline: 08/21/23: 8/10 pain at worst      09/27/23: 3-4/10 at worst with ADLs/activity.  Goal status: ACHIEVED   3.  Pt will have L knee AROM 0-120 indicative of full ROM as needed for completion of LE management, self-care ADLs, and transfers from various-height surfaces.    Baseline: 08/21/23: -8-57.      09/27/23: AROM -2-94, PROM -1-107 Goal status: IN PROGRESS  4.  Pt will increase strength of tested lower extremity musculature to at least 4+/5 or greater MMT grade in order to demonstrate improvement in strength and function  Baseline: 08/21/23: LLE strength 3- to 4/5 with exception of modified hip ABD/ADD MMT in sitting.   09/27/23: 4+ for all muscles tested with exception of hip flexion.  Goal status: IN PROGRESS/MOSTLY MET   5.  Pt will ambulate without AD at community-level distance (660 ft or greater) without LOB, symmetrical weightbearing, and no major gait deviations  Baseline: 08/21/23: Limited capacity for community-level gait, use of FWW primarily.     09/27/23: Pt able to ambulate 575 feet, developed discomfort in R hip and L foot from slip-on footwear that is not supportive and higher volume of stepping, dec velocity of LLE swing during swing phase Goal status: NOT MET    PLAN: PT FREQUENCY: 1-2x/week  PT DURATION: 6 weeks  PLANNED INTERVENTIONS: Therapeutic exercises, Therapeutic activity, Neuromuscular re-education, Balance training, Gait training, Patient/Family education, Self Care, Joint mobilization, Joint manipulation, Vestibular training,  Canalith repositioning, Orthotic/Fit training, DME instructions, Dry Needling, Electrical stimulation, Spinal manipulation, Spinal mobilization, Cryotherapy, Moist heat, Taping, Traction, Ultrasound, Ionotophoresis 4mg /ml Dexamethasone, Manual therapy, and Re-evaluation.  PLAN FOR NEXT SESSION: Progress L knee ROM as tolerated with goal to attain 115-120 deg over next 2 weeks, quad strengthening, progressive weightbearing activity, and gradually increasing emphasis on closed-chain strengthening   9:51 AM, 10/04/23 Rosamaria Lints, PT, DPT Physical Therapist - [facility] in [place]  [phone] (Office)     Bryson C, PT 10/04/2023, 9:51 AM

## 2023-10-09 ENCOUNTER — Ambulatory Visit: Payer: Medicare HMO | Admitting: Physical Therapy

## 2023-10-09 ENCOUNTER — Encounter: Payer: Self-pay | Admitting: Physical Therapy

## 2023-10-09 DIAGNOSIS — M25662 Stiffness of left knee, not elsewhere classified: Secondary | ICD-10-CM

## 2023-10-09 DIAGNOSIS — M25562 Pain in left knee: Secondary | ICD-10-CM

## 2023-10-09 DIAGNOSIS — R262 Difficulty in walking, not elsewhere classified: Secondary | ICD-10-CM

## 2023-10-09 DIAGNOSIS — M6281 Muscle weakness (generalized): Secondary | ICD-10-CM

## 2023-10-09 NOTE — Therapy (Deleted)
OUTPATIENT PHYSICAL THERAPY TREATMENT   Patient Name: Alisha Stevens MRN: [id number] DOB:06/12/1941, 82 y.o., female Today's Date: 10/09/2023   END OF SESSION:     Past Medical History:  Diagnosis Date   Cancer [facility])    Thyroid   Hyperlipemia    Hypertension    Insomnia    Osteoporosis    Personal history of radiation therapy    for thyroid   Reflux    Thyroid disease    Vitamin D deficiency    Past Surgical History:  Procedure Laterality Date   ABDOMINAL HYSTERECTOMY     BREAST SURGERY     BUNIONECTOMY Right    COLONOSCOPY     COLONOSCOPY N/A 04/16/2022   Procedure: COLONOSCOPY;  Surgeon: Jaynie Collins, DO;  Location: [facility] ENDOSCOPY;  Service: Gastroenterology;  Laterality: N/A;   COLONOSCOPY WITH PROPOFOL N/A 06/18/2020   Procedure: COLONOSCOPY WITH PROPOFOL;  Surgeon: Toledo, Boykin Nearing, MD;  Location: [facility] ENDOSCOPY;  Service: Gastroenterology;  Laterality: N/A;   ESOPHAGOGASTRODUODENOSCOPY (EGD) WITH PROPOFOL N/A 06/18/2020   Procedure: ESOPHAGOGASTRODUODENOSCOPY (EGD) WITH PROPOFOL;  Surgeon: Toledo, Boykin Nearing, MD;  Location: [facility] ENDOSCOPY;  Service: Gastroenterology;  Laterality: N/A;   ESOPHAGOGASTRODUODENOSCOPY (EGD) WITH PROPOFOL N/A 04/15/2022   Procedure: ESOPHAGOGASTRODUODENOSCOPY (EGD) WITH PROPOFOL;  Surgeon: Toney Reil, MD;  Location: [facility] ENDOSCOPY;  Service: Gastroenterology;  Laterality: N/A;   HIP ARTHROPLASTY Right    retinopexy Right    THYROIDECTOMY     TONSILLECTOMY     Patient Active Problem List   Diagnosis Date Noted   Diverticulosis of large intestine with hemorrhage    Gastroesophageal reflux disease without esophagitis 04/15/2022   Other specified hypothyroidism    Rectal bleeding 04/14/2022   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   Borderline high serum cholesterol 06/24/2015   BP (high blood pressure) 06/24/2015   Cannot sleep 06/24/2015   Carcinoma of thyroid (HCC) 06/24/2015   Reflux 06/24/2015    Osteoporosis 06/24/2015   Arthritis, degenerative 06/24/2015   Abdominal pain 05/01/2014   Constipation 05/01/2014   Diarrhea 05/01/2014   Foreign body of rectum 05/01/2014   Avitaminosis D 10/24/2012   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 Unknown Foley, MD  FOLLOW-UP APPT SCHEDULED WITH REFERRING PROVIDER: Yes ; f/u with surgeon 09/13/23  PERTINENT HISTORY: Pt is an 82 year old female s/p L TKA 08/01/23. Patient reports lot of swelling. Patient reports no significant wound care issues. Pt reports no home health PT since surgery. Patient reports hx of diabetic neuropathy with some numbness in foot/toes. Pt has some numbness around incision. Past Med hx of HTN, osteoporosis (lower bone density in lumbar spine mainly per pt), OA, post-operative hypothyroidism. Pt reports pain is fairly well controlled with Tramadol; 2x/day and pt is also using Tylenol. Pt reports mild pain at rest. Pt reports doing well with car transfers, getting in/out of house, getting around house. Hx of R THA.  PAIN:   Pain Intensity: Present: 3-4/10, Best: 1/10, Worst: 8/10 Pain location: medial knee complex, along "path of incision" Pain quality:  burning, throbbing   Swelling: Yes , L knee and leg  Numbness/Tingling: Yes; some numbness next to incision, neuropathy in feet  Focal weakness or buckling: No, pt able to ambulate with FWW  Aggravating factors: riding in car, bending knee, transfers e.g. up/down from chair  Relieving factors: Tramadol, Tylenol, cold pack/ice machine   History  of prior back, hip, or knee injury, pain, surgery, or therapy: No  Imaging: Yes ; good post-op radiographs   Prior level of function: Independent with community mobility with device Occupational demands: Retired  Presenter, broadcasting: Back to walking exercise program, DIY projects around house  Red flags: Negative for personal history of cancer, chills/fever, night sweats, nausea, vomiting, unexplained weight gain/loss, unrelenting pain  PRECAUTIONS: None  WEIGHT BEARING RESTRICTIONS:  WBAT  FALLS: Has patient fallen in last 6 months? No  Living Environment Lives with: lives with their spouse and her son  Lives in: House/apartment Built in seat shower, safety strips on shower floor, suction grab bar; raised commode. Only small threshold into home from garage. They have upstairs, but everything she uses is on first level.    Patient Goals: return to normal walking, walking routine (up to 1 mi previously, 2 years ago); able to return to exercise classes (e.g. chair aerobics); able to travel (further in car)   OBJECTIVE:   GAIT: Comments: With FWW, Dec L knee terminal knee extension at terminal swing, good heel to toe progression and normal step length, moderate forward lean onto front-wheeled walker, mild dec stance time LLE   AROM AROM (Normal range in degrees) AROM 08/21/23 AROM 09/27/23  Hip Right Left Right Left        Knee      Flexion (135) WNL 57  94  Extension (0) WNL -8  -2        Ankle      Dorsiflexion (20)  WNL    Plantarflexion (50)  WNL    Inversion (35)      Eversion (15      (* = pain; Blank rows = not tested)   L knee PROM: -5-78 09/27/23: -1-108   LE MMT: MMT (out of 5) Right 08/21/23 Left 08/21/23 Right 09/27/23 Left 09/27/23  Hip flexion 4 3+ 4 4  Hip extension      Hip abduction (seated) 4+ 4+ 4+ 4+  Hip adduction (seated) 4+ 4+ 5 5  Hip internal rotation      Hip external rotation      Knee flexion 4+ 4- 5 5  Knee extension 4+ 4 5 4+  Ankle  dorsiflexion      Ankle plantarflexion      Ankle inversion      Ankle eversion      (* = pain; Blank rows = not tested)   Palpation Location LEFT  RIGHT           Quadriceps 2 (VMO) 0  Medial Hamstrings 0 0  Lateral Hamstrings 0 0  Lateral Hamstring tendon    Medial Hamstring tendon    Quadriceps tendon 1   Patella 0   Patellar Tendon 1   Tibial Tuberosity    Medial joint line 2   Lateral joint line 1   MCL    LCL    Adductor Tubercle    Pes Anserine tendon    Infrapatellar fat pad    Fibular head    Popliteal fossa    (Blank rows = not tested) Graded on 0-4 scale (0 = no pain, 1 = pain, 2 = pain with wincing/grimacing/flinching, 3 = pain with  withdrawal, 4 = unwilling to allow palpation), (Blank rows = not tested)     TODAY'S TREATMENT:   SUBJECTIVE STATEMENT:   Patient reports mild pain this AM; 1/10 NPRS. She reports notable soreness after last visit following stretching L knee - she used ice regularly over the weekend. She reports working hard on L knee ROM at home since last visit.     Manual Therapy - for symptom modulation, soft tissue sensitivity and mobility, joint mobility, ROM   L knee PROM within patient tolerance in supine and sitting (for flexion and extension) x 10 reps Passive stretch into flexion over edge of table, seated; 2 x 30 sec  TF posterior mobilization with pt in hooklying; gr III for knee flexion mobility; 3 x 30 sec bouts in hooklying  Patellar mobilization, gr III; emphasis on superior and inferior glides; 3 x 30 sec bouts  L knee PROM 0-114 deg   Therapeutic Exercise - for improved soft tissue flexibility and extensibility as needed for ROM, improved strength as needed to improve performance of CKC activities/functional movements   Heel slide in supine with sheet around L foot; x10, 10 sec hold   NuStep, seat at 5 for increased knee flexion; Level 0, x 5 minutes - for L knee AAROM  Long arc quad; 2x10, 5 sec, with 4-lb ankle  weight  Knee flexion stretch on staircase, forward lunge; x5, 10 sec hold  Dynamic march along blue agility ladder; 4x D/B   -PT SBA  Sit to stand; 2 x 8 with no UE assist   PATIENT EDUCATION: Discussed good progress with ROM, quad strength, and weaning from use of AD    *not today* Forward and retro steps along blue agility ladder for terminal knee extension; 5x D/B with SPC RUE Cold pack (unbilled) - for anti-inflammatory and analgesic effect as needed for reduced pain and improved ability to participate in active PT intervention, along L knee in supine with pillow behind calf, x 5 minutes Standing march adjacent to treadmill armrest for UE support; x20 alternating R/L, for weight shifting and knee ROM Ambulate laps around gym; x 4 laps  -subjective information gathered Short arc quad, with large blue bolster today; 2x10, 3 sec Ambulate laps with SPC RUE; x 2 laps  -Demo from PT for [facility] gait technique and task practice with pt, PT standby assist Quad set, small rolled up sheet behind knee for tactile input; reviewed for HEP, 1x10    PATIENT EDUCATION:  Education details: see above for patient education details  Person educated: Patient and Spouse Education method: Explanation, Demonstration, and Handouts Education comprehension: verbalized understanding and returned demonstration   HOME EXERCISE PROGRAM:  Access Code: [email or web address] URL: [email or web address] Date: 08/23/2023 Prepared by: Consuela Mimes  Exercises - Supine Quad Set  - 3 x daily - 7 x weekly - 2 sets - 10 reps - 5sec hold - Supine Heel Slide with Strap  - 3 x daily - 7 x weekly - 2 sets - 10 reps - 5 sec hold - Seated Long Arc Quad  - 2 x daily - 7 x weekly - 2 sets - 10 reps - 5sec hold - Seated Knee Flexion Slide  - 2 x daily - 7 x weekly - 2 sets - 10 reps - 5sec hold  (+ handout provided for DVT signs)   ASSESSMENT:  CLINICAL IMPRESSION: Patient fortunately is approaching functional  knee flexion ROM (just shy of 115 deg) and she has [facility] terminal knee extension. She  is progressing well with stepping drills without UE support or use of AD. We can progress with SLR and higher-level quadriceps strengthening drills including CKC strengthening with successive visits. Patient has remaining post-op impairments in: decreased L knee ROM, decreased patellofemoral/tibiofemoral joint mobility, post-op swelling, decreased LE strength, gait changes, and post-operative pain. Pt will benefit from skilled PT services to address deficits for best return to PLOF.    OBJECTIVE IMPAIRMENTS: Abnormal gait, decreased activity tolerance, decreased mobility, difficulty walking, decreased ROM, decreased strength, hypomobility, increased edema, impaired flexibility, and pain.   ACTIVITY LIMITATIONS: bending, sitting, standing, squatting, stairs, transfers, bed mobility, toileting, and locomotion level  PARTICIPATION LIMITATIONS: meal prep, cleaning, driving, shopping, and community activity  PERSONAL FACTORS: Age and 3+ comorbidities: (HTN, hx of thyroid cancer and post-operative hypothyroidism, GI dysfunction)  are also affecting patient's functional outcome.   REHAB POTENTIAL: Excellent  CLINICAL DECISION MAKING: Evolving/moderate complexity  EVALUATION COMPLEXITY: Moderate   GOALS: Goals reviewed with patient? Yes  SHORT TERM GOALS: Target date: 09/11/2023  Pt will be independent with HEP to improve strength and decrease knee pain to improve pain-free function at home and work. Baseline: 08/21/23: Baseline HEP reviewed/MedBridge handout provided.      09/27/23: Pt reports completing her HEP each day, sometimes completing extra reps and sometimes doing slightly less.  Goal status: ACHIEVED  Pt will improve L knee AROM to 0-100 in first 3-4 weeks of PT as needed for improved ROM needed for transferring, self-care ADLs Baseline: 08/21/23: L knee AROM -8-57.    09/27/23: L knee AROM -2-94.  Goal  status: IN PROGRESS   LONG TERM GOALS: Target date: 10/19/2023  Pt will increase FOTO to at least 58 to demonstrate significant improvement in function at home and work related to knee pain  Baseline: 08/21/23: 37.       09/27/23: 47/58 Goal status: IN PROGRESS  2.  Pt will decrease worst knee pain by at least 3 points on the NPRS in order to demonstrate clinically significant reduction in knee pain. Baseline: 08/21/23: 8/10 pain at worst      09/27/23: 3-4/10 at worst with ADLs/activity.  Goal status: ACHIEVED   3.  Pt will have L knee AROM 0-120 indicative of full ROM as needed for completion of LE management, self-care ADLs, and transfers from various-height surfaces.    Baseline: 08/21/23: -8-57.      09/27/23: AROM -2-94, PROM -1-107 Goal status: IN PROGRESS  4.  Pt will increase strength of tested lower extremity musculature to at least 4+/5 or greater MMT grade in order to demonstrate improvement in strength and function  Baseline: 08/21/23: LLE strength 3- to 4/5 with exception of modified hip ABD/ADD MMT in sitting.   09/27/23: 4+ for all muscles tested with exception of hip flexion.  Goal status: IN PROGRESS/MOSTLY MET   5.  Pt will ambulate without AD at community-level distance (660 ft or greater) without LOB, symmetrical weightbearing, and no major gait deviations  Baseline: 08/21/23: Limited capacity for community-level gait, use of FWW primarily.     09/27/23: Pt able to ambulate 575 feet, developed discomfort in R hip and L foot from slip-on footwear that is not supportive and higher volume of stepping, dec velocity of LLE swing during swing phase Goal status: NOT MET    PLAN: PT FREQUENCY: 1-2x/week  PT DURATION: 6 weeks  PLANNED INTERVENTIONS: Therapeutic exercises, Therapeutic activity, Neuromuscular re-education, Balance training, Gait training, Patient/Family education, Self Care, Joint mobilization, Joint manipulation, Vestibular training, Canalith repositioning,  Orthotic/Fit training, DME instructions, Dry Needling, Electrical stimulation, Spinal manipulation, Spinal mobilization, Cryotherapy, Moist heat, Taping, Traction, Ultrasound, Ionotophoresis 4mg /ml Dexamethasone, Manual therapy, and Re-evaluation.  PLAN FOR NEXT SESSION: Progress L knee ROM as tolerated with goal to attain 115-120 deg over next 2 weeks, quad strengthening, progressive weightbearing activity, and gradually increasing emphasis on closed-chain strengthening   Consuela Mimes, PT, DPT #[id number]  Gertie Exon, PT 10/09/2023, 8:09 AM

## 2023-10-09 NOTE — Therapy (Signed)
OUTPATIENT PHYSICAL THERAPY TREATMENT   Patient Name: Alisha Stevens MRN: [id number] DOB:12/21/1940, 82 y.o., female Today's Date: 10/09/2023   END OF SESSION:  PT End of Session - 10/09/23 0951     Visit Number 13    Number of Visits 17    Date for PT Re-Evaluation 10/19/23    Authorization Type [employer]    Progress Note Due on Visit 20    PT Start Time [phone]    PT Stop Time 1028    PT Time Calculation (min) 42 min    Activity Tolerance Patient tolerated treatment well    Behavior During Therapy [facility] for tasks assessed/performed               Past Medical History:  Diagnosis Date   Cancer (HCC)    Thyroid   Hyperlipemia    Hypertension    Insomnia    Osteoporosis    Personal history of radiation therapy    for thyroid   Reflux    Thyroid disease    Vitamin D deficiency    Past Surgical History:  Procedure Laterality Date   ABDOMINAL HYSTERECTOMY     BREAST SURGERY     BUNIONECTOMY Right    COLONOSCOPY     COLONOSCOPY N/A 04/16/2022   Procedure: COLONOSCOPY;  Surgeon: Jaynie Collins, DO;  Location: [facility] ENDOSCOPY;  Service: Gastroenterology;  Laterality: N/A;   COLONOSCOPY WITH PROPOFOL N/A 06/18/2020   Procedure: COLONOSCOPY WITH PROPOFOL;  Surgeon: Toledo, Boykin Nearing, MD;  Location: [facility] ENDOSCOPY;  Service: Gastroenterology;  Laterality: N/A;   ESOPHAGOGASTRODUODENOSCOPY (EGD) WITH PROPOFOL N/A 06/18/2020   Procedure: ESOPHAGOGASTRODUODENOSCOPY (EGD) WITH PROPOFOL;  Surgeon: Toledo, Boykin Nearing, MD;  Location: [facility] ENDOSCOPY;  Service: Gastroenterology;  Laterality: N/A;   ESOPHAGOGASTRODUODENOSCOPY (EGD) WITH PROPOFOL N/A 04/15/2022   Procedure: ESOPHAGOGASTRODUODENOSCOPY (EGD) WITH PROPOFOL;  Surgeon: Toney Reil, MD;  Location: [facility] ENDOSCOPY;  Service: Gastroenterology;  Laterality: N/A;   HIP ARTHROPLASTY Right    retinopexy Right    THYROIDECTOMY     TONSILLECTOMY     Patient Active Problem List   Diagnosis Date  Noted   Diverticulosis of large intestine with hemorrhage    Gastroesophageal reflux disease without esophagitis 04/15/2022   Other specified hypothyroidism    Rectal bleeding 04/14/2022   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   Borderline high serum cholesterol 06/24/2015   BP (high blood pressure) 06/24/2015   Cannot sleep 06/24/2015   Carcinoma of thyroid (HCC) 06/24/2015   Reflux 06/24/2015   Osteoporosis 06/24/2015   Arthritis, degenerative 06/24/2015   Abdominal pain 05/01/2014   Constipation 05/01/2014   Diarrhea 05/01/2014   Foreign body of rectum 05/01/2014   Avitaminosis D 10/24/2012   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 Unknown Foley, MD  FOLLOW-UP APPT SCHEDULED WITH REFERRING PROVIDER: Yes ; f/u with surgeon 09/13/23  PERTINENT HISTORY: Pt is an 82 year old female s/p L TKA 08/01/23. Patient reports lot of swelling. Patient reports no significant wound care issues. Pt reports no home health PT since surgery. Patient reports hx of diabetic neuropathy with some numbness in foot/toes. Pt  has some numbness around incision. Past Med hx of HTN, osteoporosis (lower bone density in lumbar spine mainly per pt), OA, post-operative hypothyroidism. Pt reports pain is fairly well controlled with Tramadol; 2x/day and pt is also using Tylenol. Pt reports mild pain at rest. Pt reports doing well with car transfers, getting in/out of house, getting around house. Hx of R THA.  PAIN:   Pain Intensity: Present: 3-4/10, Best: 1/10, Worst: 8/10 Pain location:  medial knee complex, along "path of incision" Pain quality:  burning, throbbing   Swelling: Yes , L knee and leg Numbness/Tingling: Yes; some numbness next to incision, neuropathy in feet  Focal weakness or buckling: No, pt able to ambulate with FWW  Aggravating factors: riding in car, bending knee, transfers e.g. up/down from chair  Relieving factors: Tramadol, Tylenol, cold pack/ice machine   History of prior back, hip, or knee injury, pain, surgery, or therapy: No  Imaging: Yes ; good post-op radiographs   Prior level of function: Independent with community mobility with device Occupational demands: Retired  Presenter, broadcasting: Back to walking exercise program, DIY projects around house  Red flags: Negative for personal history of cancer, chills/fever, night sweats, nausea, vomiting, unexplained weight gain/loss, unrelenting pain  PRECAUTIONS: None  WEIGHT BEARING RESTRICTIONS:  WBAT  FALLS: Has patient fallen in last 6 months? No  Living Environment Lives with: lives with their spouse and her son  Lives in: House/apartment Built in seat shower, safety strips on shower floor, suction grab bar; raised commode. Only small threshold into home from garage. They have upstairs, but everything she uses is on first level.    Patient Goals: return to normal walking, walking routine (up to 1 mi previously, 2 years ago); able to return to exercise classes (e.g. chair aerobics); able to travel (further in car)   OBJECTIVE:   GAIT: Comments: With FWW, Dec L knee terminal knee extension at terminal swing, good heel to toe progression and normal step length, moderate forward lean onto front-wheeled walker, mild dec stance time LLE   AROM AROM (Normal range in degrees) AROM 08/21/23 AROM 09/27/23  Hip Right Left Right Left        Knee      Flexion (135) WNL 57  94  Extension (0) WNL -8  -2        Ankle      Dorsiflexion (20)  WNL    Plantarflexion (50)  WNL    Inversion (35)      Eversion  (15      (* = pain; Blank rows = not tested)   L knee PROM: -5-78 09/27/23: -1-108   LE MMT: MMT (out of 5) Right 08/21/23 Left 08/21/23 Right 09/27/23 Left 09/27/23  Hip flexion 4 3+ 4 4  Hip extension      Hip abduction (seated) 4+ 4+ 4+ 4+  Hip adduction (seated) 4+ 4+ 5 5  Hip internal rotation      Hip external rotation      Knee flexion 4+ 4- 5 5  Knee extension 4+ 4 5 4+  Ankle dorsiflexion      Ankle plantarflexion      Ankle inversion      Ankle eversion      (* = pain; Blank rows = not tested)   Palpation Location LEFT  RIGHT           Quadriceps 2 (VMO) 0  Medial Hamstrings 0 0  Lateral Hamstrings 0 0  Lateral Hamstring tendon  Medial Hamstring tendon    Quadriceps tendon 1   Patella 0   Patellar Tendon 1   Tibial Tuberosity    Medial joint line 2   Lateral joint line 1   MCL    LCL    Adductor Tubercle    Pes Anserine tendon    Infrapatellar fat pad    Fibular head    Popliteal fossa    (Blank rows = not tested) Graded on 0-4 scale (0 = no pain, 1 = pain, 2 = pain with wincing/grimacing/flinching, 3 = pain with withdrawal, 4 = unwilling to allow palpation), (Blank rows = not tested)     TODAY'S TREATMENT:   SUBJECTIVE STATEMENT:   Patient reports feeling fairly well at arrival to PT. Patient reports no notable pain this AM. She feels she did well with prn PT last visit.     Manual Therapy - for symptom modulation, soft tissue sensitivity and mobility, joint mobility, ROM   L knee PROM within patient tolerance in supine and sitting (for flexion and extension) x 10 reps Passive stretch into flexion over edge of table, seated; 2 x 30 sec  TF posterior mobilization with pt in hooklying; gr III for knee flexion mobility; 3 x 30 sec bouts in hooklying  Patellar mobilization, gr III; emphasis on superior and inferior glides; 3 x 30 sec bouts  L knee PROM 0-110 deg   Therapeutic Exercise - for improved soft tissue flexibility and  extensibility as needed for ROM, improved strength as needed to improve performance of CKC activities/functional movements   Heel slide, seated with opposite LE assist; x10, 10 sec hold   Long arc quad; 1x10 with 5-lb ankle weight, 1x10 with 7.5-lb ankle weight   Knee flexion stretch on staircase, forward lunge; x5, 10 sec hold  Dynamic march along blue agility ladder; 4x D/B   -PT SBA  Sit to stand; 2 x 8 with no UE assist  cable resisted retro stepping and forward eccentric return x58ft: 5x D/B with 40 lbs  lateral step over 6-inch hurdle, 2 feet onto either side of hurdle; 1x15 back/forth   PATIENT EDUCATION: Discussed progress made to date, continued work on knee ROM at home    *next visit* NuStep, seat at 5 for increased knee flexion; Level 0, x 5 minutes - for L knee AAROM  *not today* Forward and retro steps along blue agility ladder for terminal knee extension; 5x D/B with SPC RUE Cold pack (unbilled) - for anti-inflammatory and analgesic effect as needed for reduced pain and improved ability to participate in active PT intervention, along L knee in supine with pillow behind calf, x 5 minutes Standing march adjacent to treadmill armrest for UE support; x20 alternating R/L, for weight shifting and knee ROM Ambulate laps around gym; x 4 laps  -subjective information gathered Short arc quad, with large blue bolster today; 2x10, 3 sec Ambulate laps with SPC RUE; x 2 laps  -Demo from PT for  [facility] gait technique and task practice with pt, PT standby assist Quad set, small rolled up sheet behind knee for tactile input; reviewed for HEP, 1x10    PATIENT EDUCATION:  Education details: see above for patient education details  Person educated: Patient and Spouse Education method: Explanation, Demonstration, and Handouts Education comprehension: verbalized understanding and returned demonstration   HOME EXERCISE PROGRAM:  Access Code: [email or web address] URL:  [email or web address] Date: 08/23/2023 Prepared by: Consuela Mimes  Exercises - Supine Quad Set  - 3 x daily - 7  x weekly - 2 sets - 10 reps - 5sec hold - Supine Heel Slide with Strap  - 3 x daily - 7 x weekly - 2 sets - 10 reps - 5 sec hold - Seated Long Arc Quad  - 2 x daily - 7 x weekly - 2 sets - 10 reps - 5sec hold - Seated Knee Flexion Slide  - 2 x daily - 7 x weekly - 2 sets - 10 reps - 5sec hold  (+ handout provided for DVT signs)   ASSESSMENT:  CLINICAL IMPRESSION: Patient has slightly less knee flexion than that noted last week, but pt is still making fair progress with knee flexion. Pt is otherwise progressing very well with LE strength, weaning from AD, and ability to perform transferring/obstacle negotiation. Patient has remaining post-op impairments in: decreased L knee ROM, decreased patellofemoral/tibiofemoral joint mobility, post-op swelling, decreased LE strength, gait changes, and post-operative pain. Pt will benefit from skilled PT services to address deficits for best return to PLOF.    OBJECTIVE IMPAIRMENTS: Abnormal gait, decreased activity tolerance, decreased mobility, difficulty walking, decreased ROM, decreased strength, hypomobility, increased edema, impaired flexibility, and pain.   ACTIVITY LIMITATIONS: bending, sitting, standing, squatting, stairs, transfers, bed mobility, toileting, and locomotion level  PARTICIPATION LIMITATIONS: meal prep, cleaning, driving, shopping, and community activity  PERSONAL FACTORS: Age and 3+ comorbidities: (HTN, hx of thyroid cancer and post-operative hypothyroidism, GI dysfunction)  are also affecting patient's functional outcome.   REHAB POTENTIAL: Excellent  CLINICAL DECISION MAKING: Evolving/moderate complexity  EVALUATION COMPLEXITY: Moderate   GOALS: Goals reviewed with patient? Yes  SHORT TERM GOALS: Target date: 09/11/2023  Pt will be independent with HEP to improve strength and decrease knee  pain to improve pain-free function at home and work. Baseline: 08/21/23: Baseline HEP reviewed/MedBridge handout provided.      09/27/23: Pt reports completing her HEP each day, sometimes completing extra reps and sometimes doing slightly less.  Goal status: ACHIEVED  Pt will improve L knee AROM to 0-100 in first 3-4 weeks of PT as needed for improved ROM needed for transferring, self-care ADLs Baseline: 08/21/23: L knee AROM -8-57.    09/27/23: L knee AROM -2-94.  Goal status: IN PROGRESS   LONG TERM GOALS: Target date: 10/19/2023  Pt will increase FOTO to at least 58 to demonstrate significant improvement in function at home and work related to knee pain  Baseline: 08/21/23: 37.       09/27/23: 47/58 Goal status: IN PROGRESS  2.  Pt will decrease worst knee pain by at least 3 points on the NPRS in order to demonstrate clinically significant reduction in knee pain. Baseline: 08/21/23: 8/10 pain at worst      09/27/23: 3-4/10 at worst with ADLs/activity.  Goal status: ACHIEVED   3.  Pt will have L knee AROM 0-120 indicative of full ROM as needed for completion of LE management, self-care ADLs, and transfers from various-height surfaces.    Baseline: 08/21/23: -8-57.      09/27/23: AROM -2-94, PROM -1-107 Goal status: IN PROGRESS  4.  Pt will increase strength of tested lower extremity musculature to at least 4+/5 or greater MMT grade in order to demonstrate improvement in strength and function  Baseline: 08/21/23: LLE strength 3- to 4/5 with exception of modified hip ABD/ADD MMT in sitting.   09/27/23: 4+ for all muscles tested with exception of hip flexion.  Goal status: IN PROGRESS/MOSTLY MET   5.  Pt will ambulate without AD at community-level distance (  660 ft or greater) without LOB, symmetrical weightbearing, and no major gait deviations  Baseline: 08/21/23: Limited capacity for community-level gait, use of FWW primarily.     09/27/23: Pt able to ambulate 575 feet, developed discomfort in R hip  and L foot from slip-on footwear that is not supportive and higher volume of stepping, dec velocity of LLE swing during swing phase Goal status: NOT MET    PLAN: PT FREQUENCY: 1-2x/week  PT DURATION: 6 weeks  PLANNED INTERVENTIONS: Therapeutic exercises, Therapeutic activity, Neuromuscular re-education, Balance training, Gait training, Patient/Family education, Self Care, Joint mobilization, Joint manipulation, Vestibular training, Canalith repositioning, Orthotic/Fit training, DME instructions, Dry Needling, Electrical stimulation, Spinal manipulation, Spinal mobilization, Cryotherapy, Moist heat, Taping, Traction, Ultrasound, Ionotophoresis 4mg /ml Dexamethasone, Manual therapy, and Re-evaluation.  PLAN FOR NEXT SESSION: Progress L knee ROM as tolerated with goal to attain 115-120 deg over next 2 weeks, quad strengthening, progressive weightbearing activity, and gradually increasing emphasis on closed-chain strengthening   Consuela Mimes, PT, DPT #[id number]  Gertie Exon, PT 10/09/2023, 10:29 AM

## 2023-10-11 ENCOUNTER — Encounter: Payer: Self-pay | Admitting: Physical Therapy

## 2023-10-11 ENCOUNTER — Ambulatory Visit: Payer: Medicare HMO | Admitting: Physical Therapy

## 2023-10-11 DIAGNOSIS — R262 Difficulty in walking, not elsewhere classified: Secondary | ICD-10-CM

## 2023-10-11 DIAGNOSIS — M25562 Pain in left knee: Secondary | ICD-10-CM

## 2023-10-11 DIAGNOSIS — M6281 Muscle weakness (generalized): Secondary | ICD-10-CM

## 2023-10-11 DIAGNOSIS — M25662 Stiffness of left knee, not elsewhere classified: Secondary | ICD-10-CM

## 2023-10-11 NOTE — Therapy (Signed)
OUTPATIENT PHYSICAL THERAPY TREATMENT   Patient Name: Alisha Stevens MRN: [id number] DOB:1941/04/12, 82 y.o., female Today's Date: 10/11/2023   END OF SESSION:  PT End of Session - 10/11/23 0949     Visit Number 14    Number of Visits 17    Date for PT Re-Evaluation 10/19/23    Authorization Type [employer]    Progress Note Due on Visit 20    PT Start Time [phone]    PT Stop Time 1030    PT Time Calculation (min) 43 min    Activity Tolerance Patient tolerated treatment well    Behavior During Therapy [facility] for tasks assessed/performed                Past Medical History:  Diagnosis Date   Cancer (HCC)    Thyroid   Hyperlipemia    Hypertension    Insomnia    Osteoporosis    Personal history of radiation therapy    for thyroid   Reflux    Thyroid disease    Vitamin D deficiency    Past Surgical History:  Procedure Laterality Date   ABDOMINAL HYSTERECTOMY     BREAST SURGERY     BUNIONECTOMY Right    COLONOSCOPY     COLONOSCOPY N/A 04/16/2022   Procedure: COLONOSCOPY;  Surgeon: Jaynie Collins, DO;  Location: [facility] ENDOSCOPY;  Service: Gastroenterology;  Laterality: N/A;   COLONOSCOPY WITH PROPOFOL N/A 06/18/2020   Procedure: COLONOSCOPY WITH PROPOFOL;  Surgeon: Toledo, Boykin Nearing, MD;  Location: [facility] ENDOSCOPY;  Service: Gastroenterology;  Laterality: N/A;   ESOPHAGOGASTRODUODENOSCOPY (EGD) WITH PROPOFOL N/A 06/18/2020   Procedure: ESOPHAGOGASTRODUODENOSCOPY (EGD) WITH PROPOFOL;  Surgeon: Toledo, Boykin Nearing, MD;  Location: [facility] ENDOSCOPY;  Service: Gastroenterology;  Laterality: N/A;   ESOPHAGOGASTRODUODENOSCOPY (EGD) WITH PROPOFOL N/A 04/15/2022   Procedure: ESOPHAGOGASTRODUODENOSCOPY (EGD) WITH PROPOFOL;  Surgeon: Toney Reil, MD;  Location: [facility] ENDOSCOPY;  Service: Gastroenterology;  Laterality: N/A;   HIP ARTHROPLASTY Right    retinopexy Right    THYROIDECTOMY     TONSILLECTOMY     Patient Active Problem List   Diagnosis Date  Noted   Diverticulosis of large intestine with hemorrhage    Gastroesophageal reflux disease without esophagitis 04/15/2022   Other specified hypothyroidism    Rectal bleeding 04/14/2022   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   Borderline high serum cholesterol 06/24/2015   BP (high blood pressure) 06/24/2015   Cannot sleep 06/24/2015   Carcinoma of thyroid (HCC) 06/24/2015   Reflux 06/24/2015   Osteoporosis 06/24/2015   Arthritis, degenerative 06/24/2015   Abdominal pain 05/01/2014   Constipation 05/01/2014   Diarrhea 05/01/2014   Foreign body of rectum 05/01/2014   Avitaminosis D 10/24/2012   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 Unknown Foley, MD  FOLLOW-UP APPT SCHEDULED WITH REFERRING PROVIDER: Yes ; f/u with surgeon 09/13/23  PERTINENT HISTORY: Pt is an 82 year old female s/p L TKA 08/01/23. Patient reports lot of swelling. Patient reports no significant wound care issues. Pt reports no home health PT since surgery. Patient reports hx of diabetic neuropathy with some numbness in foot/toes.  Pt has some numbness around incision. Past Med hx of HTN, osteoporosis (lower bone density in lumbar spine mainly per pt), OA, post-operative hypothyroidism. Pt reports pain is fairly well controlled with Tramadol; 2x/day and pt is also using Tylenol. Pt reports mild pain at rest. Pt reports doing well with car transfers, getting in/out of house, getting around house. Hx of R THA.  PAIN:   Pain Intensity: Present: 3-4/10, Best: 1/10, Worst: 8/10 Pain location:  medial knee complex, along "path of incision" Pain quality:  burning, throbbing   Swelling: Yes , L knee and leg Numbness/Tingling: Yes; some numbness next to incision, neuropathy in feet  Focal weakness or buckling: No, pt able to ambulate with FWW  Aggravating factors: riding in car, bending knee, transfers e.g. up/down from chair  Relieving factors: Tramadol, Tylenol, cold pack/ice machine   History of prior back, hip, or knee injury, pain, surgery, or therapy: No  Imaging: Yes ; good post-op radiographs   Prior level of function: Independent with community mobility with device Occupational demands: Retired  Presenter, broadcasting: Back to walking exercise program, DIY projects around house  Red flags: Negative for personal history of cancer, chills/fever, night sweats, nausea, vomiting, unexplained weight gain/loss, unrelenting pain  PRECAUTIONS: None  WEIGHT BEARING RESTRICTIONS:  WBAT  FALLS: Has patient fallen in last 6 months? No  Living Environment Lives with: lives with their spouse and her son  Lives in: House/apartment Built in seat shower, safety strips on shower floor, suction grab bar; raised commode. Only small threshold into home from garage. They have upstairs, but everything she uses is on first level.    Patient Goals: return to normal walking, walking routine (up to 1 mi previously, 2 years ago); able to return to exercise classes (e.g. chair aerobics); able to travel (further in car)   OBJECTIVE:   GAIT: Comments: With FWW, Dec L knee terminal knee extension at terminal swing, good heel to toe progression and normal step length, moderate forward lean onto front-wheeled walker, mild dec stance time LLE   AROM AROM (Normal range in degrees) AROM 08/21/23 AROM 09/27/23  Hip Right Left Right Left        Knee      Flexion (135) WNL 57  94  Extension (0) WNL -8  -2        Ankle      Dorsiflexion (20)  WNL    Plantarflexion (50)  WNL    Inversion (35)      Eversion  (15      (* = pain; Blank rows = not tested)   L knee PROM: -5-78 09/27/23: -1-108   LE MMT: MMT (out of 5) Right 08/21/23 Left 08/21/23 Right 09/27/23 Left 09/27/23  Hip flexion 4 3+ 4 4  Hip extension      Hip abduction (seated) 4+ 4+ 4+ 4+  Hip adduction (seated) 4+ 4+ 5 5  Hip internal rotation      Hip external rotation      Knee flexion 4+ 4- 5 5  Knee extension 4+ 4 5 4+  Ankle dorsiflexion      Ankle plantarflexion      Ankle inversion      Ankle eversion      (* = pain; Blank rows = not tested)   Palpation Location LEFT  RIGHT           Quadriceps 2 (VMO) 0  Medial Hamstrings 0 0  Lateral Hamstrings 0 0  Lateral Hamstring tendon  Medial Hamstring tendon    Quadriceps tendon 1   Patella 0   Patellar Tendon 1   Tibial Tuberosity    Medial joint line 2   Lateral joint line 1   MCL    LCL    Adductor Tubercle    Pes Anserine tendon    Infrapatellar fat pad    Fibular head    Popliteal fossa    (Blank rows = not tested) Graded on 0-4 scale (0 = no pain, 1 = pain, 2 = pain with wincing/grimacing/flinching, 3 = pain with withdrawal, 4 = unwilling to allow palpation), (Blank rows = not tested)     TODAY'S TREATMENT:   SUBJECTIVE STATEMENT:   Patient reports 2/10 pain this AM. She feels that her L knee is very tight at arrival. She arrives with no AD. Pt reports compliance with HEP.     Manual Therapy - for symptom modulation, soft tissue sensitivity and mobility, joint mobility, ROM   L knee PROM within patient tolerance in supine and sitting (for flexion and extension) x 10 reps Passive stretch into flexion over edge of table, seated; 1 x 30 sec  TF posterior mobilization with pt in hooklying; gr III for knee flexion mobility; 3 x 30 sec bouts in hooklying  Patellar mobilization, gr III; emphasis on superior and inferior glides; 3 x 30 sec bouts  L knee PROM 0-115 deg    Therapeutic Exercise - for improved soft tissue flexibility and  extensibility as needed for ROM, improved strength as needed to improve performance of CKC activities/functional movements   Heel slide, seated with opposite LE assist; x10, 10 sec hold   Long arc quad; 2x10 with 7.5-lb ankle weight   NuStep, seat at 5 for increased knee flexion; Level 0, x 5 minutes - for L knee AAROM  Dynamic march along blue agility ladder; 4x D/B   -PT SBA  Sit to stand; 2 x 10 with no UE assist  cable resisted retro stepping and forward eccentric return x25ft: 5x D/B with 40 lbs  *next visit* lateral step over 6-inch hurdle, 2 feet onto either side of hurdle; 1x15 back/forth   PATIENT EDUCATION: Discussed progress made to date, continued work on knee ROM at home    *not today* Knee flexion stretch on staircase, forward lunge; x5, 10 sec hold Forward and retro steps along blue agility ladder for terminal knee extension; 5x D/B with SPC RUE Cold pack (unbilled) - for anti-inflammatory and analgesic effect as needed for reduced pain and improved ability to participate in active PT intervention, along L knee in supine with pillow behind calf, x 5 minutes Standing march adjacent to treadmill armrest for UE support; x20 alternating R/L, for weight shifting and knee ROM Ambulate laps around gym; x 4 laps  -subjective information gathered Short arc quad, with large blue bolster today; 2x10, 3 sec Ambulate laps with SPC RUE; x 2 laps  -Demo from PT for [facility] gait technique and task practice with pt, PT standby assist Quad set, small rolled up sheet behind knee for tactile input; reviewed for HEP, 1x10    PATIENT EDUCATION:  Education details: see above for patient education details  Person educated: Patient and Spouse Education method: Explanation, Demonstration, and Handouts Education comprehension: verbalized understanding and returned demonstration   HOME EXERCISE PROGRAM:  Access Code: [email or web address] URL: [email or web address] Date:  08/23/2023 Prepared by: Consuela Mimes  Exercises - Supine Quad Set  - 3 x daily - 7 x weekly - 2  sets - 10 reps - 5sec hold - Supine Heel Slide with Strap  - 3 x daily - 7 x weekly - 2 sets - 10 reps - 5 sec hold - Seated Long Arc Quad  - 2 x daily - 7 x weekly - 2 sets - 10 reps - 5sec hold - Seated Knee Flexion Slide  - 2 x daily - 7 x weekly - 2 sets - 10 reps - 5sec hold  (+ handout provided for DVT signs)   ASSESSMENT:  CLINICAL IMPRESSION: Patient has L knee flexion up to 115 deg at this time, just under 110 deg actively without stretching/overpressure. She is making excellent progress with LE strength, gait without AD, and ability to perform transfer/sit to stand. Pain is generally low-level, fortunately. However, pt does have discomfort with end-range flexion and remaining L knee stiffness. Patient has remaining post-op impairments in: decreased L knee ROM, decreased patellofemoral/tibiofemoral joint mobility, post-op swelling, decreased LE strength, gait changes, and post-operative pain. Pt will benefit from skilled PT services to address deficits for best return to PLOF.    OBJECTIVE IMPAIRMENTS: Abnormal gait, decreased activity tolerance, decreased mobility, difficulty walking, decreased ROM, decreased strength, hypomobility, increased edema, impaired flexibility, and pain.   ACTIVITY LIMITATIONS: bending, sitting, standing, squatting, stairs, transfers, bed mobility, toileting, and locomotion level  PARTICIPATION LIMITATIONS: meal prep, cleaning, driving, shopping, and community activity  PERSONAL FACTORS: Age and 3+ comorbidities: (HTN, hx of thyroid cancer and post-operative hypothyroidism, GI dysfunction)  are also affecting patient's functional outcome.   REHAB POTENTIAL: Excellent  CLINICAL DECISION MAKING: Evolving/moderate complexity  EVALUATION COMPLEXITY: Moderate   GOALS: Goals reviewed with patient? Yes  SHORT TERM GOALS: Target date: 09/11/2023  Pt  will be independent with HEP to improve strength and decrease knee pain to improve pain-free function at home and work. Baseline: 08/21/23: Baseline HEP reviewed/MedBridge handout provided.      09/27/23: Pt reports completing her HEP each day, sometimes completing extra reps and sometimes doing slightly less.  Goal status: ACHIEVED  Pt will improve L knee AROM to 0-100 in first 3-4 weeks of PT as needed for improved ROM needed for transferring, self-care ADLs Baseline: 08/21/23: L knee AROM -8-57.    09/27/23: L knee AROM -2-94.  Goal status: IN PROGRESS   LONG TERM GOALS: Target date: 10/19/2023  Pt will increase FOTO to at least 58 to demonstrate significant improvement in function at home and work related to knee pain  Baseline: 08/21/23: 37.       09/27/23: 47/58 Goal status: IN PROGRESS  2.  Pt will decrease worst knee pain by at least 3 points on the NPRS in order to demonstrate clinically significant reduction in knee pain. Baseline: 08/21/23: 8/10 pain at worst      09/27/23: 3-4/10 at worst with ADLs/activity.  Goal status: ACHIEVED   3.  Pt will have L knee AROM 0-120 indicative of full ROM as needed for completion of LE management, self-care ADLs, and transfers from various-height surfaces.    Baseline: 08/21/23: -8-57.      09/27/23: AROM -2-94, PROM -1-107 Goal status: IN PROGRESS  4.  Pt will increase strength of tested lower extremity musculature to at least 4+/5 or greater MMT grade in order to demonstrate improvement in strength and function  Baseline: 08/21/23: LLE strength 3- to 4/5 with exception of modified hip ABD/ADD MMT in sitting.   09/27/23: 4+ for all muscles tested with exception of hip flexion.  Goal status: IN PROGRESS/MOSTLY MET  5.  Pt will ambulate without AD at community-level distance (660 ft or greater) without LOB, symmetrical weightbearing, and no major gait deviations  Baseline: 08/21/23: Limited capacity for community-level gait, use of FWW primarily.      09/27/23: Pt able to ambulate 575 feet, developed discomfort in R hip and L foot from slip-on footwear that is not supportive and higher volume of stepping, dec velocity of LLE swing during swing phase Goal status: NOT MET    PLAN: PT FREQUENCY: 1-2x/week  PT DURATION: 6 weeks  PLANNED INTERVENTIONS: Therapeutic exercises, Therapeutic activity, Neuromuscular re-education, Balance training, Gait training, Patient/Family education, Self Care, Joint mobilization, Joint manipulation, Vestibular training, Canalith repositioning, Orthotic/Fit training, DME instructions, Dry Needling, Electrical stimulation, Spinal manipulation, Spinal mobilization, Cryotherapy, Moist heat, Taping, Traction, Ultrasound, Ionotophoresis 4mg /ml Dexamethasone, Manual therapy, and Re-evaluation.  PLAN FOR NEXT SESSION: Progress L knee ROM as tolerated with goal to attain 115-120 deg over next 2 weeks, quad strengthening, progressive weightbearing activity, and gradually increasing emphasis on closed-chain strengthening   Consuela Mimes, PT, DPT #[id number]  Gertie Exon, PT 10/11/2023, 12:50 PM

## 2023-10-16 ENCOUNTER — Encounter: Payer: Self-pay | Admitting: Physical Therapy

## 2023-10-16 ENCOUNTER — Ambulatory Visit: Payer: Medicare HMO | Admitting: Physical Therapy

## 2023-10-16 DIAGNOSIS — M25562 Pain in left knee: Secondary | ICD-10-CM | POA: Diagnosis not present

## 2023-10-16 DIAGNOSIS — M6281 Muscle weakness (generalized): Secondary | ICD-10-CM

## 2023-10-16 DIAGNOSIS — R262 Difficulty in walking, not elsewhere classified: Secondary | ICD-10-CM

## 2023-10-16 DIAGNOSIS — M25662 Stiffness of left knee, not elsewhere classified: Secondary | ICD-10-CM

## 2023-10-16 NOTE — Therapy (Signed)
OUTPATIENT PHYSICAL THERAPY TREATMENT   Patient Name: Alisha Stevens MRN: [id number] DOB:Jan 15, 1941, 82 y.o., female Today's Date: 10/16/2023   END OF SESSION:  PT End of Session - 10/16/23 0945     Visit Number 15    Number of Visits 17    Date for PT Re-Evaluation 10/19/23    Authorization Type [employer]    Progress Note Due on Visit 20    PT Start Time 0945    PT Stop Time 1027    PT Time Calculation (min) 42 min    Activity Tolerance Patient tolerated treatment well    Behavior During Therapy WFL for tasks assessed/performed                 Past Medical History:  Diagnosis Date   Cancer (HCC)    Thyroid   Hyperlipemia    Hypertension    Insomnia    Osteoporosis    Personal history of radiation therapy    for thyroid   Reflux    Thyroid disease    Vitamin D deficiency    Past Surgical History:  Procedure Laterality Date   ABDOMINAL HYSTERECTOMY     BREAST SURGERY     BUNIONECTOMY Right    COLONOSCOPY     COLONOSCOPY N/A 04/16/2022   Procedure: COLONOSCOPY;  Surgeon: Jaynie Collins, DO;  Location: [facility] ENDOSCOPY;  Service: Gastroenterology;  Laterality: N/A;   COLONOSCOPY WITH PROPOFOL N/A 06/18/2020   Procedure: COLONOSCOPY WITH PROPOFOL;  Surgeon: Toledo, Boykin Nearing, MD;  Location: [facility] ENDOSCOPY;  Service: Gastroenterology;  Laterality: N/A;   ESOPHAGOGASTRODUODENOSCOPY (EGD) WITH PROPOFOL N/A 06/18/2020   Procedure: ESOPHAGOGASTRODUODENOSCOPY (EGD) WITH PROPOFOL;  Surgeon: Toledo, Boykin Nearing, MD;  Location: [facility] ENDOSCOPY;  Service: Gastroenterology;  Laterality: N/A;   ESOPHAGOGASTRODUODENOSCOPY (EGD) WITH PROPOFOL N/A 04/15/2022   Procedure: ESOPHAGOGASTRODUODENOSCOPY (EGD) WITH PROPOFOL;  Surgeon: Toney Reil, MD;  Location: [facility] ENDOSCOPY;  Service: Gastroenterology;  Laterality: N/A;   HIP ARTHROPLASTY Right    retinopexy Right    THYROIDECTOMY     TONSILLECTOMY     Patient Active Problem List   Diagnosis Date  Noted   Diverticulosis of large intestine with hemorrhage    Gastroesophageal reflux disease without esophagitis 04/15/2022   Other specified hypothyroidism    Rectal bleeding 04/14/2022   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   Borderline high serum cholesterol 06/24/2015   BP (high blood pressure) 06/24/2015   Cannot sleep 06/24/2015   Carcinoma of thyroid (HCC) 06/24/2015   Reflux 06/24/2015   Osteoporosis 06/24/2015   Arthritis, degenerative 06/24/2015   Abdominal pain 05/01/2014   Constipation 05/01/2014   Diarrhea 05/01/2014   Foreign body of rectum 05/01/2014   Avitaminosis D 10/24/2012   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 Unknown Foley, MD  FOLLOW-UP APPT SCHEDULED WITH REFERRING PROVIDER: Yes ; f/u with surgeon 09/13/23  PERTINENT HISTORY: Pt is an 82 year old female s/p L TKA 08/01/23. Patient reports lot of swelling. Patient reports no significant wound care issues. Pt reports no home health PT since surgery. Patient reports hx of diabetic neuropathy with some numbness in  foot/toes. Pt has some numbness around incision. Past Med hx of HTN, osteoporosis (lower bone density in lumbar spine mainly per pt), OA, post-operative hypothyroidism. Pt reports pain is fairly well controlled with Tramadol; 2x/day and pt is also using Tylenol. Pt reports mild pain at rest. Pt reports doing well with car transfers, getting in/out of house, getting around house. Hx of R THA.  PAIN:   Pain Intensity: Present: 3-4/10, Best: 1/10, Worst: 8/10 Pain location:  medial knee complex, along "path of incision" Pain quality:  burning, throbbing   Swelling: Yes , L knee and leg Numbness/Tingling: Yes; some numbness next to incision, neuropathy in feet  Focal weakness or buckling: No, pt able to ambulate with FWW  Aggravating factors: riding in car, bending knee, transfers e.g. up/down from chair  Relieving factors: Tramadol, Tylenol, cold pack/ice machine   History of prior back, hip, or knee injury, pain, surgery, or therapy: No  Imaging: Yes ; good post-op radiographs   Prior level of function: Independent with community mobility with device Occupational demands: Retired  Presenter, broadcasting: Back to walking exercise program, DIY projects around house  Red flags: Negative for personal history of cancer, chills/fever, night sweats, nausea, vomiting, unexplained weight gain/loss, unrelenting pain  PRECAUTIONS: None  WEIGHT BEARING RESTRICTIONS:  WBAT  FALLS: Has patient fallen in last 6 months? No  Living Environment Lives with: lives with their spouse and her son  Lives in: House/apartment Built in seat shower, safety strips on shower floor, suction grab bar; raised commode. Only small threshold into home from garage. They have upstairs, but everything she uses is on first level.    Patient Goals: return to normal walking, walking routine (up to 1 mi previously, 2 years ago); able to return to exercise classes (e.g. chair aerobics); able to travel (further in car)   OBJECTIVE:   GAIT: Comments: With FWW, Dec L knee terminal knee extension at terminal swing, good heel to toe progression and normal step length, moderate forward lean onto front-wheeled walker, mild dec stance time LLE   AROM AROM (Normal range in degrees) AROM 08/21/23 AROM 09/27/23  Hip Right Left Right Left        Knee      Flexion (135) WNL 57  94  Extension (0) WNL -8  -2        Ankle      Dorsiflexion (20)  WNL    Plantarflexion (50)  WNL    Inversion (35)      Eversion  (15      (* = pain; Blank rows = not tested)   L knee PROM: -5-78 09/27/23: -1-108   LE MMT: MMT (out of 5) Right 08/21/23 Left 08/21/23 Right 09/27/23 Left 09/27/23  Hip flexion 4 3+ 4 4  Hip extension      Hip abduction (seated) 4+ 4+ 4+ 4+  Hip adduction (seated) 4+ 4+ 5 5  Hip internal rotation      Hip external rotation      Knee flexion 4+ 4- 5 5  Knee extension 4+ 4 5 4+  Ankle dorsiflexion      Ankle plantarflexion      Ankle inversion      Ankle eversion      (* = pain; Blank rows = not tested)   Palpation Location LEFT  RIGHT           Quadriceps 2 (VMO) 0  Medial Hamstrings 0 0  Lateral Hamstrings 0 0  Lateral Hamstring tendon  Medial Hamstring tendon    Quadriceps tendon 1   Patella 0   Patellar Tendon 1   Tibial Tuberosity    Medial joint line 2   Lateral joint line 1   MCL    LCL    Adductor Tubercle    Pes Anserine tendon    Infrapatellar fat pad    Fibular head    Popliteal fossa    (Blank rows = not tested) Graded on 0-4 scale (0 = no pain, 1 = pain, 2 = pain with wincing/grimacing/flinching, 3 = pain with withdrawal, 4 = unwilling to allow palpation), (Blank rows = not tested)     TODAY'S TREATMENT:   SUBJECTIVE STATEMENT:   Patient reports feeling generally well at arrival. She feels she is walking generally well at this time. Pt is not using her cane much at all at this time. Patient reports compliance with HEP.    Manual Therapy - for symptom modulation, soft tissue sensitivity and mobility, joint mobility, ROM   L knee PROM within patient tolerance in supine and sitting (for flexion and extension) x 15 reps  TF posterior mobilization with pt in hooklying; gr III for knee flexion mobility; 2 x 30 sec bouts in hooklying  Patellar mobilization, gr III; emphasis on superior and inferior glides; 2 x 30 sec bouts  L knee PROM 0-116 deg   *not today* Passive stretch into flexion over edge of table, seated; 1 x 30  sec   Therapeutic Exercise - for improved soft tissue flexibility and extensibility as needed for ROM, improved strength as needed to improve performance of CKC activities/functional movements     NuStep, seat at 5 for increased knee flexion; Level 0, x 5 minutes - for L knee AAROM  Supine heel slide, seated with bed sheet; x10, 5 sec hold   Bridge; 2x10  SLR; 2x10  Sit to stand; 2 x 8 with 6-lb Medball goblet hold   Dynamic march along blue agility ladder; 4x D/B   -PT SBA  Forward step up, 6-inch step; 2x10  // bars, obstacle course: 3 6-inch hurdles, 2 Airex pads; 4x D/B  PATIENT EDUCATION: Discussed progress made to date; HEP update and review.    *not today* Knee flexion stretch on staircase, forward lunge; x5, 10 sec hold Forward and retro steps along blue agility ladder for terminal knee extension; 5x D/B with SPC RUE Cold pack (unbilled) - for anti-inflammatory and analgesic effect as needed for reduced pain and improved ability to participate in active PT intervention, along L knee in supine with pillow behind calf, x 5 minutes Standing march adjacent to treadmill armrest for UE support; x20 alternating R/L, for weight shifting and knee ROM Ambulate laps around gym; x 4 laps  -subjective information gathered Short arc quad, with large blue bolster today; 2x10, 3 sec Ambulate laps with SPC RUE; x 2 laps  -Demo from PT for [facility] gait technique and task practice with pt, PT standby assist Quad set, small rolled up sheet behind knee for tactile input; reviewed for HEP, 1x10    PATIENT EDUCATION:  Education details: see above for patient education details  Person educated: Patient and Spouse Education method: Explanation, Demonstration, and Handouts Education comprehension: verbalized understanding and returned demonstration   HOME EXERCISE PROGRAM:  Access Code: [email or web address] URL: [email or web address] Date: 10/16/2023 Prepared by: Consuela Mimes  Exercises - Supine Quad Set  - 3 x daily - 7 x weekly - 2 sets - 10 reps - 5sec hold -  Supine Heel Slide with Strap  - 3 x daily - 7 x weekly - 2 sets - 10 reps - 5 sec hold - Supine Active Straight Leg Raise  - 2 x daily - 7 x weekly - 2 sets - 10 reps - Seated Knee Flexion Slide  - 2 x daily - 7 x weekly - 2 sets - 10 reps - 5sec hold - Sit to Stand Without Arm Support  - 2 x daily - 7 x weekly - 2 sets - 8-10 reps   ASSESSMENT:  CLINICAL IMPRESSION: Patient is making excellent progress with gait and ability to perform stepping up, sit to stand, and closed-chain functional movements. Patient has minimal pain at this time - only discomfort with end-range knee flexion. Pt is attaining functional L knee ROM at this time, though some stiffness with flexion remains. Patient has remaining post-op impairments in: decreased L knee ROM, decreased patellofemoral/tibiofemoral joint mobility, post-op swelling, decreased LE strength, gait changes, and post-operative pain. Pt will benefit from skilled PT services to address deficits for best return to PLOF.    OBJECTIVE IMPAIRMENTS: Abnormal gait, decreased activity tolerance, decreased mobility, difficulty walking, decreased ROM, decreased strength, hypomobility, increased edema, impaired flexibility, and pain.   ACTIVITY LIMITATIONS: bending, sitting, standing, squatting, stairs, transfers, bed mobility, toileting, and locomotion level  PARTICIPATION LIMITATIONS: meal prep, cleaning, driving, shopping, and community activity  PERSONAL FACTORS: Age and 3+ comorbidities: (HTN, hx of thyroid cancer and post-operative hypothyroidism, GI dysfunction)  are also affecting patient's functional outcome.   REHAB POTENTIAL: Excellent  CLINICAL DECISION MAKING: Evolving/moderate complexity  EVALUATION COMPLEXITY: Moderate   GOALS: Goals reviewed with patient? Yes  SHORT TERM GOALS: Target date: 09/11/2023  Pt will be independent with HEP to  improve strength and decrease knee pain to improve pain-free function at home and work. Baseline: 08/21/23: Baseline HEP reviewed/MedBridge handout provided.      09/27/23: Pt reports completing her HEP each day, sometimes completing extra reps and sometimes doing slightly less.  Goal status: ACHIEVED  Pt will improve L knee AROM to 0-100 in first 3-4 weeks of PT as needed for improved ROM needed for transferring, self-care ADLs Baseline: 08/21/23: L knee AROM -8-57.    09/27/23: L knee AROM -2-94.  Goal status: IN PROGRESS   LONG TERM GOALS: Target date: 10/19/2023  Pt will increase FOTO to at least 58 to demonstrate significant improvement in function at home and work related to knee pain  Baseline: 08/21/23: 37.       09/27/23: 47/58 Goal status: IN PROGRESS  2.  Pt will decrease worst knee pain by at least 3 points on the NPRS in order to demonstrate clinically significant reduction in knee pain. Baseline: 08/21/23: 8/10 pain at worst      09/27/23: 3-4/10 at worst with ADLs/activity.  Goal status: ACHIEVED   3.  Pt will have L knee AROM 0-120 indicative of full ROM as needed for completion of LE management, self-care ADLs, and transfers from various-height surfaces.    Baseline: 08/21/23: -8-57.      09/27/23: AROM -2-94, PROM -1-107 Goal status: IN PROGRESS  4.  Pt will increase strength of tested lower extremity musculature to at least 4+/5 or greater MMT grade in order to demonstrate improvement in strength and function  Baseline: 08/21/23: LLE strength 3- to 4/5 with exception of modified hip ABD/ADD MMT in sitting.   09/27/23: 4+ for all muscles tested with exception of hip flexion.  Goal status: IN PROGRESS/MOSTLY MET  5.  Pt will ambulate without AD at community-level distance (660 ft or greater) without LOB, symmetrical weightbearing, and no major gait deviations  Baseline: 08/21/23: Limited capacity for community-level gait, use of FWW primarily.     09/27/23: Pt able to ambulate 575  feet, developed discomfort in R hip and L foot from slip-on footwear that is not supportive and higher volume of stepping, dec velocity of LLE swing during swing phase Goal status: NOT MET    PLAN: PT FREQUENCY: 1-2x/week  PT DURATION: 6 weeks  PLANNED INTERVENTIONS: Therapeutic exercises, Therapeutic activity, Neuromuscular re-education, Balance training, Gait training, Patient/Family education, Self Care, Joint mobilization, Joint manipulation, Vestibular training, Canalith repositioning, Orthotic/Fit training, DME instructions, Dry Needling, Electrical stimulation, Spinal manipulation, Spinal mobilization, Cryotherapy, Moist heat, Taping, Traction, Ultrasound, Ionotophoresis 4mg /ml Dexamethasone, Manual therapy, and Re-evaluation.  PLAN FOR NEXT SESSION: Progress L knee ROM as tolerated with goal to attain 115-120 deg over next 2 weeks, quad strengthening, progressive weightbearing activity, and gradually increasing emphasis on closed-chain strengthening   Consuela Mimes, PT, DPT #[id number]  Gertie Exon, PT 10/16/2023, 10:29 AM

## 2023-10-17 NOTE — Therapy (Unsigned)
OUTPATIENT PHYSICAL THERAPY TREATMENT   Patient Name: Alisha Stevens MRN: [id number] DOB:17-May-1941, 82 y.o., female Today's Date: 10/18/2023   END OF SESSION:  PT End of Session - 10/18/23 0950     Visit Number 16    Number of Visits 17    Date for PT Re-Evaluation 10/19/23    Authorization Type [employer]    Progress Note Due on Visit 20    PT Start Time [phone]    PT Stop Time 1029    PT Time Calculation (min) 41 min    Activity Tolerance Patient tolerated treatment well    Behavior During Therapy [facility] for tasks assessed/performed              Past Medical History:  Diagnosis Date   Cancer (HCC)    Thyroid   Hyperlipemia    Hypertension    Insomnia    Osteoporosis    Personal history of radiation therapy    for thyroid   Reflux    Thyroid disease    Vitamin D deficiency    Past Surgical History:  Procedure Laterality Date   ABDOMINAL HYSTERECTOMY     BREAST SURGERY     BUNIONECTOMY Right    COLONOSCOPY     COLONOSCOPY N/A 04/16/2022   Procedure: COLONOSCOPY;  Surgeon: Jaynie Collins, DO;  Location: [facility] ENDOSCOPY;  Service: Gastroenterology;  Laterality: N/A;   COLONOSCOPY WITH PROPOFOL N/A 06/18/2020   Procedure: COLONOSCOPY WITH PROPOFOL;  Surgeon: Toledo, Boykin Nearing, MD;  Location: [facility] ENDOSCOPY;  Service: Gastroenterology;  Laterality: N/A;   ESOPHAGOGASTRODUODENOSCOPY (EGD) WITH PROPOFOL N/A 06/18/2020   Procedure: ESOPHAGOGASTRODUODENOSCOPY (EGD) WITH PROPOFOL;  Surgeon: Toledo, Boykin Nearing, MD;  Location: [facility] ENDOSCOPY;  Service: Gastroenterology;  Laterality: N/A;   ESOPHAGOGASTRODUODENOSCOPY (EGD) WITH PROPOFOL N/A 04/15/2022   Procedure: ESOPHAGOGASTRODUODENOSCOPY (EGD) WITH PROPOFOL;  Surgeon: Toney Reil, MD;  Location: [facility] ENDOSCOPY;  Service: Gastroenterology;  Laterality: N/A;   HIP ARTHROPLASTY Right    retinopexy Right    THYROIDECTOMY     TONSILLECTOMY     Patient Active Problem List   Diagnosis Date Noted    Diverticulosis of large intestine with hemorrhage    Gastroesophageal reflux disease without esophagitis 04/15/2022   Other specified hypothyroidism    Rectal bleeding 04/14/2022   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   Borderline high serum cholesterol 06/24/2015   BP (high blood pressure) 06/24/2015   Cannot sleep 06/24/2015   Carcinoma of thyroid (HCC) 06/24/2015   Reflux 06/24/2015   Osteoporosis 06/24/2015   Arthritis, degenerative 06/24/2015   Abdominal pain 05/01/2014   Constipation 05/01/2014   Diarrhea 05/01/2014   Foreign body of rectum 05/01/2014   Avitaminosis D 10/24/2012   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 Unknown Foley, MD  FOLLOW-UP APPT SCHEDULED WITH REFERRING PROVIDER: Yes ; f/u with surgeon 09/13/23  PERTINENT HISTORY: Pt is an 82 year old female s/p L TKA 08/01/23. Patient reports lot of swelling. Patient reports no significant wound care issues. Pt reports no home health PT since surgery. Patient reports hx of diabetic neuropathy with some numbness in foot/toes. Pt has  some numbness around incision. Past Med hx of HTN, osteoporosis (lower bone density in lumbar spine mainly per pt), OA, post-operative hypothyroidism. Pt reports pain is fairly well controlled with Tramadol; 2x/day and pt is also using Tylenol. Pt reports mild pain at rest. Pt reports doing well with car transfers, getting in/out of house, getting around house. Hx of R THA.  PAIN:   Pain Intensity: Present: 3-4/10, Best: 1/10, Worst: 8/10 Pain location: medial  knee complex, along "path of incision" Pain quality:  burning, throbbing   Swelling: Yes , L knee and leg Numbness/Tingling: Yes; some numbness next to incision, neuropathy in feet  Focal weakness or buckling: No, pt able to ambulate with FWW  Aggravating factors: riding in car, bending knee, transfers e.g. up/down from chair  Relieving factors: Tramadol, Tylenol, cold pack/ice machine   History of prior back, hip, or knee injury, pain, surgery, or therapy: No  Imaging: Yes ; good post-op radiographs   Prior level of function: Independent with community mobility with device Occupational demands: Retired  Presenter, broadcasting: Back to walking exercise program, DIY projects around house  Red flags: Negative for personal history of cancer, chills/fever, night sweats, nausea, vomiting, unexplained weight gain/loss, unrelenting pain  PRECAUTIONS: None  WEIGHT BEARING RESTRICTIONS:  WBAT  FALLS: Has patient fallen in last 6 months? No  Living Environment Lives with: lives with their spouse and her son  Lives in: House/apartment Built in seat shower, safety strips on shower floor, suction grab bar; raised commode. Only small threshold into home from garage. They have upstairs, but everything she uses is on first level.    Patient Goals: return to normal walking, walking routine (up to 1 mi previously, 2 years ago); able to return to exercise classes (e.g. chair aerobics); able to travel (further in car)   OBJECTIVE:   GAIT: Comments: With FWW, Dec L knee terminal knee extension at terminal swing, good heel to toe progression and normal step length, moderate forward lean onto front-wheeled walker, mild dec stance time LLE   AROM AROM (Normal range in degrees) AROM 08/21/23 AROM 09/27/23  Hip Right Left Right Left        Knee      Flexion (135) WNL 57  94  Extension (0) WNL -8  -2        Ankle      Dorsiflexion (20)  WNL    Plantarflexion (50)  WNL    Inversion (35)      Eversion (15       (* = pain; Blank rows = not tested)   L knee PROM: -5-78 09/27/23: -1-108   LE MMT: MMT (out of 5) Right 08/21/23 Left 08/21/23 Right 09/27/23 Left 09/27/23  Hip flexion 4 3+ 4 4  Hip extension      Hip abduction (seated) 4+ 4+ 4+ 4+  Hip adduction (seated) 4+ 4+ 5 5  Hip internal rotation      Hip external rotation      Knee flexion 4+ 4- 5 5  Knee extension 4+ 4 5 4+  Ankle dorsiflexion      Ankle plantarflexion      Ankle inversion      Ankle eversion      (* = pain; Blank rows = not tested)   Palpation Location LEFT  RIGHT           Quadriceps 2 (VMO) 0  Medial Hamstrings 0 0  Lateral Hamstrings 0 0  Lateral Hamstring tendon  Medial Hamstring tendon    Quadriceps tendon 1   Patella 0   Patellar Tendon 1   Tibial Tuberosity    Medial joint line 2   Lateral joint line 1   MCL    LCL    Adductor Tubercle    Pes Anserine tendon    Infrapatellar fat pad    Fibular head    Popliteal fossa    (Blank rows = not tested) Graded on 0-4 scale (0 = no pain, 1 = pain, 2 = pain with wincing/grimacing/flinching, 3 = pain with withdrawal, 4 = unwilling to allow palpation), (Blank rows = not tested)     TODAY'S TREATMENT:   SUBJECTIVE STATEMENT:   Patient reports her L knee is doing well at arrival. She reports working on her gait pattern to reduce stiffness and improve knee flexion with swing phase. Patient reports compliance with HEP and continuing to work on ROM at home.     Manual Therapy - for symptom modulation, soft tissue sensitivity and mobility, joint mobility, ROM   L knee PROM within patient tolerance in supine and sitting (for flexion and extension) x 15 reps  TF posterior mobilization with pt in hooklying; gr III for knee flexion mobility at end-range; 2 x 30 sec bouts in hooklying  Patellar mobilization, gr III; emphasis on superior and inferior glides; 3 x 30 sec bouts  L knee PROM 0-117 deg   *not today* Passive stretch into flexion over  edge of table, seated; 1 x 30 sec   Therapeutic Exercise - for improved soft tissue flexibility and extensibility as needed for ROM, improved strength as needed to improve performance of CKC activities/functional movements   L knee AROM: 0-110 L knee PROM: 0-117   NuStep, seat at 5 for increased knee flexion; Level 4, x 5 minutes - for L knee AAROM  -subjective information gathered during this time  Supine heel slide, seated with bed sheet; x10, 5 sec hold   Bridge; 2x10  SLR; 2x10  Sit to stand; 2 x 8 with 6-lb Medball goblet hold   Dynamic march along blue agility ladder; 4x D/B   -PT SBA  // bars, obstacle course: 3 6-inch hurdles, 2 Airex pads; 4x D/B // bars: Minisquat; 2x10, 0-45 deg  -verbal cueing and demo for technique to avoid excessive anterior tibial translation   PATIENT EDUCATION: Discussed progress made to date and expectations moving forward with PT/prognosis.   *next visit* Forward step up, 6-inch step; 2x10   *not today* Knee flexion stretch on staircase, forward lunge; x5, 10 sec hold Forward and retro steps along blue agility ladder for terminal knee extension; 5x D/B with SPC RUE Cold pack (unbilled) - for anti-inflammatory and analgesic effect as needed for reduced pain and improved ability to participate in active PT intervention, along L knee in supine with pillow behind calf, x 5 minutes Standing march adjacent to treadmill armrest for UE support; x20 alternating R/L, for weight shifting and knee ROM Ambulate laps around gym; x 4 laps  -subjective information gathered Short arc quad, with large blue bolster today; 2x10, 3 sec Ambulate laps with SPC RUE; x 2 laps  -Demo from PT for [facility] gait technique and task practice with pt, PT standby assist Quad set, small rolled up sheet behind knee for tactile input; reviewed for HEP, 1x10    PATIENT EDUCATION:  Education details: see above for patient education details  Person educated: Patient and  Spouse Education method: Explanation, Demonstration, and Handouts  Education comprehension: verbalized understanding and returned demonstration   HOME EXERCISE PROGRAM:  Access Code: [email or web address] URL: [email or web address] Date: 10/16/2023 Prepared by: Consuela Mimes  Exercises - Supine Quad Set  - 3 x daily - 7 x weekly - 2 sets - 10 reps - 5sec hold - Supine Heel Slide with Strap  - 3 x daily - 7 x weekly - 2 sets - 10 reps - 5 sec hold - Supine Active Straight Leg Raise  - 2 x daily - 7 x weekly - 2 sets - 10 reps - Seated Knee Flexion Slide  - 2 x daily - 7 x weekly - 2 sets - 10 reps - 5sec hold - Sit to Stand Without Arm Support  - 2 x daily - 7 x weekly - 2 sets - 8-10 reps   ASSESSMENT:  CLINICAL IMPRESSION: Patient demonstrates improved knee flexion with swing phase and improving ability to perform CKC loading as needed for squatting and transferring. She has low-level pain at baseline, but she experiences moderate discomfort with end-range knee flexion. Pt has attained up to 117 deg knee flexion. Pt is making excellent progress at this time with ability to perform functional mobility tasks e.g. gait, step-up, step-down, stepping over.  Patient has remaining post-op impairments in: decreased L knee ROM, decreased patellofemoral/tibiofemoral joint mobility, post-op swelling, decreased LE strength, gait changes, and post-operative pain. Pt will benefit from skilled PT services to address deficits for best return to PLOF.    OBJECTIVE IMPAIRMENTS: Abnormal gait, decreased activity tolerance, decreased mobility, difficulty walking, decreased ROM, decreased strength, hypomobility, increased edema, impaired flexibility, and pain.   ACTIVITY LIMITATIONS: bending, sitting, standing, squatting, stairs, transfers, bed mobility, toileting, and locomotion level  PARTICIPATION LIMITATIONS: meal prep, cleaning, driving, shopping, and community activity  PERSONAL FACTORS: Age and  3+ comorbidities: (HTN, hx of thyroid cancer and post-operative hypothyroidism, GI dysfunction)  are also affecting patient's functional outcome.   REHAB POTENTIAL: Excellent  CLINICAL DECISION MAKING: Evolving/moderate complexity  EVALUATION COMPLEXITY: Moderate   GOALS: Goals reviewed with patient? Yes  SHORT TERM GOALS: Target date: 09/11/2023  Pt will be independent with HEP to improve strength and decrease knee pain to improve pain-free function at home and work. Baseline: 08/21/23: Baseline HEP reviewed/MedBridge handout provided.      09/27/23: Pt reports completing her HEP each day, sometimes completing extra reps and sometimes doing slightly less.  Goal status: ACHIEVED  Pt will improve L knee AROM to 0-100 in first 3-4 weeks of PT as needed for improved ROM needed for transferring, self-care ADLs Baseline: 08/21/23: L knee AROM -8-57.    09/27/23: L knee AROM -2-94.  Goal status: IN PROGRESS   LONG TERM GOALS: Target date: 10/19/2023  Pt will increase FOTO to at least 58 to demonstrate significant improvement in function at home and work related to knee pain  Baseline: 08/21/23: 37.       09/27/23: 47/58 Goal status: IN PROGRESS  2.  Pt will decrease worst knee pain by at least 3 points on the NPRS in order to demonstrate clinically significant reduction in knee pain. Baseline: 08/21/23: 8/10 pain at worst      09/27/23: 3-4/10 at worst with ADLs/activity.  Goal status: ACHIEVED   3.  Pt will have L knee AROM 0-120 indicative of full ROM as needed for completion of LE management, self-care ADLs, and transfers from various-height surfaces.    Baseline: 08/21/23: -8-57.      09/27/23: AROM -2-94, PROM -1-107 Goal status: IN  PROGRESS  4.  Pt will increase strength of tested lower extremity musculature to at least 4+/5 or greater MMT grade in order to demonstrate improvement in strength and function  Baseline: 08/21/23: LLE strength 3- to 4/5 with exception of modified hip ABD/ADD  MMT in sitting.   09/27/23: 4+ for all muscles tested with exception of hip flexion.  Goal status: IN PROGRESS/MOSTLY MET   5.  Pt will ambulate without AD at community-level distance (660 ft or greater) without LOB, symmetrical weightbearing, and no major gait deviations  Baseline: 08/21/23: Limited capacity for community-level gait, use of FWW primarily.     09/27/23: Pt able to ambulate 575 feet, developed discomfort in R hip and L foot from slip-on footwear that is not supportive and higher volume of stepping, dec velocity of LLE swing during swing phase Goal status: NOT MET    PLAN: PT FREQUENCY: 1-2x/week  PT DURATION: 6 weeks  PLANNED INTERVENTIONS: Therapeutic exercises, Therapeutic activity, Neuromuscular re-education, Balance training, Gait training, Patient/Family education, Self Care, Joint mobilization, Joint manipulation, Vestibular training, Canalith repositioning, Orthotic/Fit training, DME instructions, Dry Needling, Electrical stimulation, Spinal manipulation, Spinal mobilization, Cryotherapy, Moist heat, Taping, Traction, Ultrasound, Ionotophoresis 4mg /ml Dexamethasone, Manual therapy, and Re-evaluation.  PLAN FOR NEXT SESSION: Progress L knee ROM as tolerated with goal to attain 115-120 deg over next 2 weeks, quad strengthening, progressive weightbearing activity, and gradually increasing emphasis on closed-chain strengthening   Consuela Mimes, PT, DPT #[id number]  Gertie Exon, PT 10/18/2023, 12:47 PM

## 2023-10-18 ENCOUNTER — Ambulatory Visit: Payer: Medicare HMO | Admitting: Physical Therapy

## 2023-10-18 ENCOUNTER — Encounter: Payer: Self-pay | Admitting: Physical Therapy

## 2023-10-18 DIAGNOSIS — M6281 Muscle weakness (generalized): Secondary | ICD-10-CM

## 2023-10-18 DIAGNOSIS — M25662 Stiffness of left knee, not elsewhere classified: Secondary | ICD-10-CM

## 2023-10-18 DIAGNOSIS — R262 Difficulty in walking, not elsewhere classified: Secondary | ICD-10-CM

## 2023-10-18 DIAGNOSIS — M25562 Pain in left knee: Secondary | ICD-10-CM

## 2023-10-23 ENCOUNTER — Encounter: Payer: Self-pay | Admitting: Physical Therapy

## 2023-10-23 ENCOUNTER — Ambulatory Visit: Payer: Medicare HMO | Attending: Orthopedic Surgery | Admitting: Physical Therapy

## 2023-10-23 DIAGNOSIS — M25662 Stiffness of left knee, not elsewhere classified: Secondary | ICD-10-CM | POA: Diagnosis present

## 2023-10-23 DIAGNOSIS — M6281 Muscle weakness (generalized): Secondary | ICD-10-CM | POA: Diagnosis present

## 2023-10-23 DIAGNOSIS — R262 Difficulty in walking, not elsewhere classified: Secondary | ICD-10-CM

## 2023-10-23 DIAGNOSIS — M25562 Pain in left knee: Secondary | ICD-10-CM

## 2023-10-23 NOTE — Therapy (Signed)
OUTPATIENT PHYSICAL THERAPY TREATMENT/GOAL UPDATE AND RE-CERTIFICATION   Patient Name: Alisha Stevens MRN: [id number] DOB:14-Mar-1941, 82 y.o., female Today's Date: 10/23/2023   END OF SESSION:  PT End of Session - 10/23/23 0944     Visit Number 17    Number of Visits 25    Date for PT Re-Evaluation 11/21/23    Authorization Type [employer]    PT Start Time 0945    PT Stop Time 1028    PT Time Calculation (min) 43 min    Activity Tolerance Patient tolerated treatment well    Behavior During Therapy WFL for tasks assessed/performed               Past Medical History:  Diagnosis Date   Cancer (HCC)    Thyroid   Hyperlipemia    Hypertension    Insomnia    Osteoporosis    Personal history of radiation therapy    for thyroid   Reflux    Thyroid disease    Vitamin D deficiency    Past Surgical History:  Procedure Laterality Date   ABDOMINAL HYSTERECTOMY     BREAST SURGERY     BUNIONECTOMY Right    COLONOSCOPY     COLONOSCOPY N/A 04/16/2022   Procedure: COLONOSCOPY;  Surgeon: Jaynie Collins, DO;  Location: [facility] ENDOSCOPY;  Service: Gastroenterology;  Laterality: N/A;   COLONOSCOPY WITH PROPOFOL N/A 06/18/2020   Procedure: COLONOSCOPY WITH PROPOFOL;  Surgeon: Toledo, Boykin Nearing, MD;  Location: [facility] ENDOSCOPY;  Service: Gastroenterology;  Laterality: N/A;   ESOPHAGOGASTRODUODENOSCOPY (EGD) WITH PROPOFOL N/A 06/18/2020   Procedure: ESOPHAGOGASTRODUODENOSCOPY (EGD) WITH PROPOFOL;  Surgeon: Toledo, Boykin Nearing, MD;  Location: [facility] ENDOSCOPY;  Service: Gastroenterology;  Laterality: N/A;   ESOPHAGOGASTRODUODENOSCOPY (EGD) WITH PROPOFOL N/A 04/15/2022   Procedure: ESOPHAGOGASTRODUODENOSCOPY (EGD) WITH PROPOFOL;  Surgeon: Toney Reil, MD;  Location: [facility] ENDOSCOPY;  Service: Gastroenterology;  Laterality: N/A;   HIP ARTHROPLASTY Right    retinopexy Right    THYROIDECTOMY     TONSILLECTOMY     Patient Active Problem List   Diagnosis Date Noted    Diverticulosis of large intestine with hemorrhage    Gastroesophageal reflux disease without esophagitis 04/15/2022   Other specified hypothyroidism    Rectal bleeding 04/14/2022   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   Borderline high serum cholesterol 06/24/2015   BP (high blood pressure) 06/24/2015   Cannot sleep 06/24/2015   Carcinoma of thyroid (HCC) 06/24/2015   Reflux 06/24/2015   Osteoporosis 06/24/2015   Arthritis, degenerative 06/24/2015   Abdominal pain 05/01/2014   Constipation 05/01/2014   Diarrhea 05/01/2014   Foreign body of rectum 05/01/2014   Avitaminosis D 10/24/2012   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 Unknown Foley, MD  FOLLOW-UP APPT SCHEDULED WITH REFERRING PROVIDER: Yes ; f/u with surgeon 09/13/23  PERTINENT HISTORY: Pt is an 82 year old female s/p L TKA 08/01/23. Patient reports lot of swelling. Patient reports no significant wound care issues. Pt reports no home health PT since surgery. Patient reports hx of diabetic neuropathy with some numbness in foot/toes. Pt has some numbness around incision. Past  Med hx of HTN, osteoporosis (lower bone density in lumbar spine mainly per pt), OA, post-operative hypothyroidism. Pt reports pain is fairly well controlled with Tramadol; 2x/day and pt is also using Tylenol. Pt reports mild pain at rest. Pt reports doing well with car transfers, getting in/out of house, getting around house. Hx of R THA.  PAIN:   Pain Intensity: Present: 3-4/10, Best: 1/10, Worst: 8/10 Pain location: medial  knee complex, along "path of incision" Pain quality:  burning, throbbing   Swelling: Yes , L knee and leg Numbness/Tingling: Yes; some numbness next to incision, neuropathy in feet  Focal weakness or buckling: No, pt able to ambulate with FWW  Aggravating factors: riding in car, bending knee, transfers e.g. up/down from chair  Relieving factors: Tramadol, Tylenol, cold pack/ice machine   History of prior back, hip, or knee injury, pain, surgery, or therapy: No  Imaging: Yes ; good post-op radiographs   Prior level of function: Independent with community mobility with device Occupational demands: Retired  Presenter, broadcasting: Back to walking exercise program, DIY projects around house  Red flags: Negative for personal history of cancer, chills/fever, night sweats, nausea, vomiting, unexplained weight gain/loss, unrelenting pain  PRECAUTIONS: None  WEIGHT BEARING RESTRICTIONS:  WBAT  FALLS: Has patient fallen in last 6 months? No  Living Environment Lives with: lives with their spouse and her son  Lives in: House/apartment Built in seat shower, safety strips on shower floor, suction grab bar; raised commode. Only small threshold into home from garage. They have upstairs, but everything she uses is on first level.    Patient Goals: return to normal walking, walking routine (up to 1 mi previously, 2 years ago); able to return to exercise classes (e.g. chair aerobics); able to travel (further in car)   OBJECTIVE:   GAIT: Comments: With FWW, Dec L knee terminal knee extension at terminal swing, good heel to toe progression and normal step length, moderate forward lean onto front-wheeled walker, mild dec stance time LLE   AROM AROM (Normal range in degrees) AROM 08/21/23 AROM 09/27/23 AROM 10/23/23  Hip Right Left Right Left Right Left  Le        Knee        Flexion (135) WNL 57  94 WNL 115  Extension (0) WNL -8  -2 WNL -2          Ankle        Dorsiflexion (20)  WNL       Plantarflexion (50)  WNL      Inversion (35)        Eversion (15        (* = pain; Blank rows = not tested)   L knee PROM: -5-78 09/27/23: -1-108 10/23/23: -1-119   LE MMT: MMT (out of 5) Right 08/21/23 Left 08/21/23 Right 09/27/23 Left 09/27/23 Right 10/23/23 Left 10/23/23  Hip flexion 4 3+ 4 4 4  4+  Hip extension        Hip abduction (seated) 4+ 4+ 4+ 4+ 5 5  Hip adduction (seated) 4+ 4+ 5 5 5 5   Hip internal rotation        Hip external rotation        Knee flexion 4+ 4- 5 5 4+ 5  Knee extension 4+ 4 5 4+ 5 4+  Ankle dorsiflexion        Ankle plantarflexion        Ankle inversion        Ankle eversion        (* =  pain; Blank rows = not tested)      TODAY'S TREATMENT:   SUBJECTIVE STATEMENT:   Patient reports completing prolonged standing the other day and having increase in swelling. She states she can tolerate prolonged standing relatively well at this time. Pt reports 40% global rating of improvement at this time. She states she needs better mobility of her L knee. Patient feels that her balance needs improving. Patient reports having to use her hands more for getting up out of chair, but she has been able to transfer without reliance on upper limbs. Pt reports she is still trying to figure out her best position for sleeping. Pt wants to be able to walk more distance. Pt reports months of comorbid R groin pain and she has concerns with her R THA (completed in 2019).     Manual Therapy - for symptom modulation, soft tissue sensitivity and mobility, joint mobility, ROM   L knee PROM within patient tolerance in supine and sitting (for flexion and extension) x 15 reps  TF posterior mobilization with pt in hooklying; gr III for knee flexion mobility at end-range; 2 x 30 sec bouts in hooklying  Patellar mobilization, gr III; emphasis on superior and inferior glides; 3 x 30 sec bouts   *not today* Passive stretch into flexion over edge of table, seated; 1 x 30  sec    Therapeutic Exercise - for improved soft tissue flexibility and extensibility as needed for ROM, improved strength as needed to improve performance of CKC activities/functional movements  *GOAL UPDATE PERFORMED   NuStep, seat at 5 for increased knee flexion; Level 3, x 5 minutes - for L knee AAROM  -subjective information gathered during this time   Ambulate laps in hallway for community-level distance; x 700 ft  -min cueing for terminal knee extension at terminal swing   PATIENT EDUCATION: Discussed current progress and goals met in PT, prognosis, continued POC.    *next visit* Bridge; 2x10 SLR; 2x10 Sit to stand; 2 x 8 with 6-lb Medball goblet hold  Dynamic march along blue agility ladder; 4x D/B   -PT SBA // bars, obstacle course: 3 6-inch hurdles, 2 Airex pads; 4x D/B // bars: Minisquat; 2x10, 0-45 deg  -verbal cueing and demo for technique to avoid excessive anterior tibial translation Forward step up, 6-inch step; 2x10    *not today* Supine heel slide, seated with bed sheet; x10, 5 sec hold  Knee flexion stretch on staircase, forward lunge; x5, 10 sec hold Forward and retro steps along blue agility ladder for terminal knee extension; 5x D/B with SPC RUE Cold pack (unbilled) - for anti-inflammatory and analgesic effect as needed for reduced pain and improved ability to participate in active PT intervention, along L knee in supine with pillow behind calf, x 5 minutes Standing march adjacent to treadmill armrest for UE support; x20 alternating R/L, for weight shifting and knee ROM Ambulate laps around gym; x 4 laps  -subjective information gathered Short arc quad, with large blue bolster today; 2x10, 3 sec Ambulate laps with SPC RUE; x 2 laps  -Demo from PT for [facility] gait technique and task practice with pt, PT standby assist Quad set, small rolled up sheet behind knee for tactile input; reviewed for HEP, 1x10    PATIENT EDUCATION:  Education details: see  above for patient education details  Person educated: Patient and Spouse Education method: Explanation, Demonstration, and Handouts Education comprehension: verbalized understanding and returned demonstration   HOME EXERCISE PROGRAM:  Access Code:  [email or web address] URL: [email or web address] Date: 10/16/2023 Prepared by: Consuela Mimes  Exercises - Supine Quad Set  - 3 x daily - 7 x weekly - 2 sets - 10 reps - 5sec hold - Supine Heel Slide with Strap  - 3 x daily - 7 x weekly - 2 sets - 10 reps - 5 sec hold - Supine Active Straight Leg Raise  - 2 x daily - 7 x weekly - 2 sets - 10 reps - Seated Knee Flexion Slide  - 2 x daily - 7 x weekly - 2 sets - 10 reps - 5sec hold - Sit to Stand Without Arm Support  - 2 x daily - 7 x weekly - 2 sets - 8-10 reps   ASSESSMENT:  CLINICAL IMPRESSION: Patient has made further progress with FOTO - she is 5 points shy of long-term FOTO goal indicative of functional progress and optimal ADL performance. Patient has almost met ROM goal and is well within standard error of measurement for goniometry for meeting short and long-term ROM goals. She has met strength goal. She is able to ambulate for community-level distance safety without AD. She does have some swelling and pain with prolonged standing at this time; we discussed modifying activity/volume of weightbearing as needed with use of pain/swelling as guide. In spite of significant ROM deficits and impairments at initial evaluation, patient has made good progress with PT and is approaching end-phase of rehab. Pt will need focus on patient-specific needs for balance, obstacle negotiation, and closed-chain strength needed for transfers. Patient has remaining post-op impairments in: mild end-range L knee flexion/extension ROM deficits, L knee stiffness, post-operative pain, and activity limitations with prolonged standing/walking and obstacle/stair negotiation. Pt will benefit from skilled PT services to  address deficits for best return to PLOF.    OBJECTIVE IMPAIRMENTS: Abnormal gait, decreased activity tolerance, decreased mobility, difficulty walking, decreased ROM, decreased strength, hypomobility, increased edema, impaired flexibility, and pain.   ACTIVITY LIMITATIONS: bending, sitting, standing, squatting, stairs, transfers, bed mobility, toileting, and locomotion level  PARTICIPATION LIMITATIONS: meal prep, cleaning, driving, shopping, and community activity  PERSONAL FACTORS: Age and 3+ comorbidities: (HTN, hx of thyroid cancer and post-operative hypothyroidism, GI dysfunction)  are also affecting patient's functional outcome.   REHAB POTENTIAL: Excellent  CLINICAL DECISION MAKING: Evolving/moderate complexity  EVALUATION COMPLEXITY: Moderate   GOALS: Goals reviewed with patient? Yes  SHORT TERM GOALS: Target date: 09/11/2023  Pt will be independent with HEP to improve strength and decrease knee pain to improve pain-free function at home and work. Baseline: 08/21/23: Baseline HEP reviewed/MedBridge handout provided.      09/27/23: Pt reports completing her HEP each day, sometimes completing extra reps and sometimes doing slightly less.  Goal status: ACHIEVED  Pt will improve L knee AROM to 0-100 in first 3-4 weeks of PT as needed for improved ROM needed for transferring, self-care ADLs Baseline: 08/21/23: L knee AROM -8-57.    09/27/23: L knee AROM -2-94.    10/23/23: L knee AROM -2-115.  Goal status: IN PROGRESS/MOSTLY MET    LONG TERM GOALS: Target date: 10/19/2023  Pt will increase FOTO to at least 58 to demonstrate significant improvement in function at home and work related to knee pain  Baseline: 08/21/23: 37.       09/27/23: 47/58.    10/23/23: 53/58 Goal status: IN PROGRESS  2.  Pt will decrease worst knee pain by at least 3 points on the NPRS in order to demonstrate clinically significant reduction in knee pain. Baseline:  08/21/23: 8/10 pain at worst      09/27/23:  3-4/10 at worst with ADLs/activity.  Goal status: ACHIEVED   3.  Pt will have L knee AROM 0-120 indicative of full ROM as needed for completion of LE management, self-care ADLs, and transfers from various-height surfaces.    Baseline: 08/21/23: -8-57.      09/27/23: AROM -2-94, PROM -1-107      10/23/23: L knee AROM -2-115, PROM -1-119 Goal status: IN PROGRESS/MOSTLY MET   4.  Pt will increase strength of tested lower extremity musculature to at least 4+/5 or greater MMT grade in order to demonstrate improvement in strength and function  Baseline: 08/21/23: LLE strength 3- to 4/5 with exception of modified hip ABD/ADD MMT in sitting.   09/27/23: 4+ for all muscles tested with exception of hip flexion.    10/23/23: 4+/5 or greater for LE  Goal status: ACHIEVED   5.  Pt will ambulate without AD at community-level distance (660 ft or greater) without LOB, symmetrical weightbearing, and no major gait deviations  Baseline: 08/21/23: Limited capacity for community-level gait, use of FWW primarily.     09/27/23: Pt able to ambulate 575 feet, developed discomfort in R hip and L foot from slip-on footwear that is not supportive and higher volume of stepping, dec velocity of LLE swing during swing phase.      10/23/23: Pt able to ambulate > 660 ft with no significant gait deviations.  Goal status: ACHIEVED   PLAN: PT FREQUENCY: 1-2x/week  PT DURATION: 3-4 weeks  PLANNED INTERVENTIONS: Therapeutic exercises, Therapeutic activity, Neuromuscular re-education, Balance training, Gait training, Patient/Family education, Self Care, Joint mobilization, Joint manipulation, Vestibular training, Canalith repositioning, Orthotic/Fit training, DME instructions, Dry Needling, Electrical stimulation, Spinal manipulation, Spinal mobilization, Cryotherapy, Moist heat, Taping, Traction, Ultrasound, Ionotophoresis 4mg /ml Dexamethasone, Manual therapy, and Re-evaluation.  PLAN FOR NEXT SESSION: Progress with end-range L knee ROM  for flexion/extension. Continue with progressive weightbearing functional activities and emphasis on CKC strengthening prior to discharge.   Consuela Mimes, PT, DPT #[id number]  Gertie Exon, PT 10/23/2023, 10:30 AM

## 2023-10-25 ENCOUNTER — Encounter: Payer: Self-pay | Admitting: Physical Therapy

## 2023-10-25 ENCOUNTER — Ambulatory Visit: Payer: Medicare HMO | Admitting: Physical Therapy

## 2023-10-25 DIAGNOSIS — M25662 Stiffness of left knee, not elsewhere classified: Secondary | ICD-10-CM

## 2023-10-25 DIAGNOSIS — M6281 Muscle weakness (generalized): Secondary | ICD-10-CM

## 2023-10-25 DIAGNOSIS — M25562 Pain in left knee: Secondary | ICD-10-CM

## 2023-10-25 DIAGNOSIS — R262 Difficulty in walking, not elsewhere classified: Secondary | ICD-10-CM

## 2023-10-25 NOTE — Therapy (Signed)
OUTPATIENT PHYSICAL THERAPY TREATMENT  Patient Name: Alisha Stevens MRN: [id number] DOB:03-12-1941, 82 y.o., female Today's Date: 10/25/2023   END OF SESSION:  PT End of Session - 10/25/23 1000     Visit Number 18    Number of Visits 25    Date for PT Re-Evaluation 11/21/23    Authorization Type [employer]    PT Start Time [phone]    PT Stop Time 1034    PT Time Calculation (min) 39 min    Activity Tolerance Patient tolerated treatment well    Behavior During Therapy [facility] for tasks assessed/performed              Past Medical History:  Diagnosis Date   Cancer (HCC)    Thyroid   Hyperlipemia    Hypertension    Insomnia    Osteoporosis    Personal history of radiation therapy    for thyroid   Reflux    Thyroid disease    Vitamin D deficiency    Past Surgical History:  Procedure Laterality Date   ABDOMINAL HYSTERECTOMY     BREAST SURGERY     BUNIONECTOMY Right    COLONOSCOPY     COLONOSCOPY N/A 04/16/2022   Procedure: COLONOSCOPY;  Surgeon: Jaynie Collins, DO;  Location: [facility] END[facility]COPY;  Service: Gastroenterology;  Laterality: N/A;   COLONOSCOPY WITH PROPOFOL N/A 06/18/2020   Procedure: COLONOSCOPY WITH PROPOFOL;  Surgeon: Toledo, Boykin Nearing, MD;  Location: [facility] ENDOSCOPY;  Service: Gastroenterology;  Laterality: N/A;   ESOPHAGOGASTRODUODENOSCOPY (EGD) WITH PROPOFOL N/A 06/18/2020   Procedure: ESOPHAGOGASTRODUODENOSCOPY (EGD) WITH PROPOFOL;  Surgeon: Toledo, Boykin Nearing, MD;  Location: [facility] ENDOSCOPY;  Service: Gastroenterology;  Laterality: N/A;   ESOPHAGOGASTRODUODENOSCOPY (EGD) WITH PROPOFOL N/A 04/15/2022   Procedure: ESOPHAGOGASTRODUODENOSCOPY (EGD) WITH PROPOFOL;  Surgeon: Toney Reil, MD;  Location: [facility] ENDOSCOPY;  Service: Gastroenterology;  Laterality: N/A;   HIP ARTHROPLASTY Right    retinopexy Right    THYROIDECTOMY     TONSILLECTOMY     Patient Active Problem List   Diagnosis Date Noted   Diverticulosis of large intestine  with hemorrhage    Gastroesophageal reflux disease without esophagitis 04/15/2022   Other specified hypothyroidism    Rectal bleeding 04/14/2022   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   Borderline high serum cholesterol 06/24/2015   BP (high blood pressure) 06/24/2015   Cannot sleep 06/24/2015   Carcinoma of thyroid (HCC) 06/24/2015   Reflux 06/24/2015   Osteoporosis 06/24/2015   Arthritis, degenerative 06/24/2015   Abdominal pain 05/01/2014   Constipation 05/01/2014   Diarrhea 05/01/2014   Foreign body of rectum 05/01/2014   Avitaminosis D 10/24/2012   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 Unknown Foley, MD  FOLLOW-UP APPT SCHEDULED WITH REFERRING PROVIDER: Yes ; f/u with surgeon 09/13/23  PERTINENT HISTORY: Pt is an 82 year old female s/p L TKA 08/01/23. Patient reports lot of swelling. Patient reports no significant wound care issues. Pt reports no home health PT since surgery. Patient reports hx of diabetic neuropathy with some numbness in foot/toes. Pt has some numbness around incision. Past Med hx of HTN, osteoporosis (  lower bone density in lumbar spine mainly per pt), OA, post-operative hypothyroidism. Pt reports pain is fairly well controlled with Tramadol; 2x/day and pt is also using Tylenol. Pt reports mild pain at rest. Pt reports doing well with car transfers, getting in/out of house, getting around house. Hx of R THA.  PAIN:   Pain Intensity: Present: 3-4/10, Best: 1/10, Worst: 8/10 Pain location: medial knee complex, along "path of  incision" Pain quality:  burning, throbbing   Swelling: Yes , L knee and leg Numbness/Tingling: Yes; some numbness next to incision, neuropathy in feet  Focal weakness or buckling: No, pt able to ambulate with FWW  Aggravating factors: riding in car, bending knee, transfers e.g. up/down from chair  Relieving factors: Tramadol, Tylenol, cold pack/ice machine   History of prior back, hip, or knee injury, pain, surgery, or therapy: No  Imaging: Yes ; good post-op radiographs   Prior level of function: Independent with community mobility with device Occupational demands: Retired  Presenter, broadcasting: Back to walking exercise program, DIY projects around house  Red flags: Negative for personal history of cancer, chills/fever, night sweats, nausea, vomiting, unexplained weight gain/loss, unrelenting pain  PRECAUTIONS: None  WEIGHT BEARING RESTRICTIONS:  WBAT  FALLS: Has patient fallen in last 6 months? No  Living Environment Lives with: lives with their spouse and her son  Lives in: House/apartment Built in seat shower, safety strips on shower floor, suction grab bar; raised commode. Only small threshold into home from garage. They have upstairs, but everything she uses is on first level.    Patient Goals: return to normal walking, walking routine (up to 1 mi previously, 2 years ago); able to return to exercise classes (e.g. chair aerobics); able to travel (further in car)   OBJECTIVE:   GAIT: Comments: With FWW, Dec L knee terminal knee extension at terminal swing, good heel to toe progression and normal step length, moderate forward lean onto front-wheeled walker, mild dec stance time LLE   AROM AROM (Normal range in degrees) AROM 08/21/23 AROM 09/27/23 AROM 10/23/23  Hip Right Left Right Left Right Left  Le        Knee        Flexion (135) WNL 57  94 WNL 115  Extension (0) WNL -8  -2 WNL -2          Ankle        Dorsiflexion (20)  WNL      Plantarflexion (50)  WNL      Inversion  (35)        Eversion (15        (* = pain; Blank rows = not tested)   L knee PROM: -5-78 09/27/23: -1-108 10/23/23: -1-119   LE MMT: MMT (out of 5) Right 08/21/23 Left 08/21/23 Right 09/27/23 Left 09/27/23 Right 10/23/23 Left 10/23/23  Hip flexion 4 3+ 4 4 4  4+  Hip extension        Hip abduction (seated) 4+ 4+ 4+ 4+ 5 5  Hip adduction (seated) 4+ 4+ 5 5 5 5   Hip internal rotation        Hip external rotation        Knee flexion 4+ 4- 5 5 4+ 5  Knee extension 4+ 4 5 4+ 5 4+  Ankle dorsiflexion        Ankle plantarflexion        Ankle inversion        Ankle eversion        (* =  pain; Blank rows = not tested)      TODAY'S TREATMENT:   SUBJECTIVE STATEMENT:   Patient reports tightness at arrival to PT. She reports no significant knee pain this AM. Patient is following up with orthopedist on 11/08/23 to discuss comorbid R hip pain. Patient reports compliance with HEP.     Manual Therapy - for symptom modulation, soft tissue sensitivity and mobility, joint mobility, ROM   L knee PROM within patient tolerance in supine and sitting (for flexion and extension) x 15 reps  TF posterior mobilization with pt in hooklying; gr III for knee flexion mobility at end-range; 2 x 30 sec bouts in hooklying  Patellar mobilization, gr III; emphasis on superior and inferior glides; 3 x 30 sec bouts   *not today* Passive stretch into flexion over edge of table, seated; 1 x 30 sec    Therapeutic Exercise - for improved soft tissue flexibility and extensibility as needed for ROM, improved strength as needed to improve performance of CKC activities/functional movements   NuStep, seat at 5 for increased knee flexion; Level 3, x 6 minutes - for L knee AAROM  -subjective information gathered during this time  Bridge; 2x10 SLR; 2x10; 2-lb ankle weight Sit to stand; 2 x 10 with 6-lb Medball goblet hold   // bars: Minisquat; 2x10, 0-45 deg  -verbal cueing and demo for technique to avoid  excessive anterior tibial translation // bars, obstacle course: 3 6-inch hurdles, 2 Airex pads; 4x D/B   *next visit* Forward step up, 6-inch step; 2x10    PATIENT EDUCATION: Discussed current progress and expectations with remainder of PT visits.     *not today* Dynamic march along blue agility ladder; 4x D/B   -PT SBA Supine heel slide, seated with bed sheet; x10, 5 sec hold  Knee flexion stretch on staircase, forward lunge; x5, 10 sec hold Forward and retro steps along blue agility ladder for terminal knee extension; 5x D/B with SPC RUE Cold pack (unbilled) - for anti-inflammatory and analgesic effect as needed for reduced pain and improved ability to participate in active PT intervention, along L knee in supine with pillow behind calf, x 5 minutes Standing march adjacent to treadmill armrest for UE support; x20 alternating R/L, for weight shifting and knee ROM Ambulate laps around gym; x 4 laps  -subjective information gathered Short arc quad, with large blue bolster today; 2x10, 3 sec Ambulate laps with SPC RUE; x 2 laps  -Demo from PT for [facility] gait technique and task practice with pt, PT standby assist Quad set, small rolled up sheet behind knee for tactile input; reviewed for HEP, 1x10    PATIENT EDUCATION:  Education details: see above for patient education details  Person educated: Patient and Spouse Education method: Explanation, Demonstration, and Handouts Education comprehension: verbalized understanding and returned demonstration   HOME EXERCISE PROGRAM:  Access Code: [email or web address] URL: [email or web address] Date: 10/16/2023 Prepared by: Consuela Mimes  Exercises - Supine Quad Set  - 3 x daily - 7 x weekly - 2 sets - 10 reps - 5sec hold - Supine Heel Slide with Strap  - 3 x daily - 7 x weekly - 2 sets - 10 reps - 5 sec hold - Supine Active Straight Leg Raise  - 2 x daily - 7 x weekly - 2 sets - 10 reps - Seated Knee Flexion Slide  - 2 x daily - 7  x weekly - 2 sets - 10 reps - 5sec hold - Sit to Stand Without  Arm Support  - 2 x daily - 7 x weekly - 2 sets - 8-10 reps   ASSESSMENT:  CLINICAL IMPRESSION: Patient has normalizing L knee ROM with only mild end-range knee flexion motion deficits. Patient is improving in ability to perform transferring and obstacle negotiation. Patient is independent and safe with stair negotiation. Pt is completely weaned from AD and is doing well with community-level functional mobility. Pt will need focus on patient-specific needs for balance, obstacle negotiation, and closed-chain strength needed for transfers. Patient has remaining post-op impairments in: mild end-range L knee flexion/extension ROM deficits, L knee stiffness, post-operative pain, and activity limitations with prolonged standing/walking and obstacle/stair negotiation. Pt will benefit from skilled PT services to address deficits for best return to PLOF.    OBJECTIVE IMPAIRMENTS: Abnormal gait, decreased activity tolerance, decreased mobility, difficulty walking, decreased ROM, decreased strength, hypomobility, increased edema, impaired flexibility, and pain.   ACTIVITY LIMITATIONS: bending, sitting, standing, squatting, stairs, transfers, bed mobility, toileting, and locomotion level  PARTICIPATION LIMITATIONS: meal prep, cleaning, driving, shopping, and community activity  PERSONAL FACTORS: Age and 3+ comorbidities: (HTN, hx of thyroid cancer and post-operative hypothyroidism, GI dysfunction)  are also affecting patient's functional outcome.   REHAB POTENTIAL: Excellent  CLINICAL DECISION MAKING: Evolving/moderate complexity  EVALUATION COMPLEXITY: Moderate   GOALS: Goals reviewed with patient? Yes  SHORT TERM GOALS: Target date: 09/11/2023  Pt will be independent with HEP to improve strength and decrease knee pain to improve pain-free function at home and work. Baseline: 08/21/23: Baseline HEP reviewed/MedBridge handout provided.       09/27/23: Pt reports completing her HEP each day, sometimes completing extra reps and sometimes doing slightly less.  Goal status: ACHIEVED  Pt will improve L knee AROM to 0-100 in first 3-4 weeks of PT as needed for improved ROM needed for transferring, self-care ADLs Baseline: 08/21/23: L knee AROM -8-57.    09/27/23: L knee AROM -2-94.    10/23/23: L knee AROM -2-115.  Goal status: IN PROGRESS/MOSTLY MET    LONG TERM GOALS: Target date: 10/19/2023  Pt will increase FOTO to at least 58 to demonstrate significant improvement in function at home and work related to knee pain  Baseline: 08/21/23: 37.       09/27/23: 47/58.    10/23/23: 53/58 Goal status: IN PROGRESS  2.  Pt will decrease worst knee pain by at least 3 points on the NPRS in order to demonstrate clinically significant reduction in knee pain. Baseline: 08/21/23: 8/10 pain at worst      09/27/23: 3-4/10 at worst with ADLs/activity.  Goal status: ACHIEVED   3.  Pt will have L knee AROM 0-120 indicative of full ROM as needed for completion of LE management, self-care ADLs, and transfers from various-height surfaces.    Baseline: 08/21/23: -8-57.      09/27/23: AROM -2-94, PROM -1-107      10/23/23: L knee AROM -2-115, PROM -1-119 Goal status: IN PROGRESS/MOSTLY MET   4.  Pt will increase strength of tested lower extremity musculature to at least 4+/5 or greater MMT grade in order to demonstrate improvement in strength and function  Baseline: 08/21/23: LLE strength 3- to 4/5 with exception of modified hip ABD/ADD MMT in sitting.   09/27/23: 4+ for all muscles tested with exception of hip flexion.    10/23/23: 4+/5 or greater for LE  Goal status: ACHIEVED   5.  Pt will ambulate without AD at community-level distance (660 ft or greater) without LOB, symmetrical weightbearing,  and no major gait deviations  Baseline: 08/21/23: Limited capacity for community-level gait, use of FWW primarily.     09/27/23: Pt able to ambulate 575 feet, developed  discomfort in R hip and L foot from slip-on footwear that is not supportive and higher volume of stepping, dec velocity of LLE swing during swing phase.      10/23/23: Pt able to ambulate > 660 ft with no significant gait deviations.  Goal status: ACHIEVED   PLAN: PT FREQUENCY: 1-2x/week  PT DURATION: 3-4 weeks  PLANNED INTERVENTIONS: Therapeutic exercises, Therapeutic activity, Neuromuscular re-education, Balance training, Gait training, Patient/Family education, Self Care, Joint mobilization, Joint manipulation, Vestibular training, Canalith repositioning, Orthotic/Fit training, DME instructions, Dry Needling, Electrical stimulation, Spinal manipulation, Spinal mobilization, Cryotherapy, Moist heat, Taping, Traction, Ultrasound, Ionotophoresis 4mg /ml Dexamethasone, Manual therapy, and Re-evaluation.  PLAN FOR NEXT SESSION: Progress with end-range L knee ROM for flexion/extension. Continue with progressive weightbearing functional activities and emphasis on CKC strengthening prior to discharge.   Consuela Mimes, PT, DPT #[id number]  Gertie Exon, PT 10/25/2023, 10:53 AM

## 2023-10-30 ENCOUNTER — Ambulatory Visit: Payer: Medicare HMO | Admitting: Physical Therapy

## 2023-11-01 ENCOUNTER — Encounter: Payer: Self-pay | Admitting: Physical Therapy

## 2023-11-01 ENCOUNTER — Ambulatory Visit: Payer: Medicare HMO | Admitting: Physical Therapy

## 2023-11-01 DIAGNOSIS — M25662 Stiffness of left knee, not elsewhere classified: Secondary | ICD-10-CM

## 2023-11-01 DIAGNOSIS — M25562 Pain in left knee: Secondary | ICD-10-CM | POA: Diagnosis not present

## 2023-11-01 DIAGNOSIS — M6281 Muscle weakness (generalized): Secondary | ICD-10-CM

## 2023-11-01 DIAGNOSIS — R262 Difficulty in walking, not elsewhere classified: Secondary | ICD-10-CM

## 2023-11-01 NOTE — Therapy (Signed)
OUTPATIENT PHYSICAL THERAPY TREATMENT  Patient Name: Alisha Stevens MRN: [id number] DOB:1941/07/24, 82 y.o., female Today's Date: 11/01/2023   END OF SESSION:  PT End of Session - 11/01/23 1634     Visit Number 19    Number of Visits 25    Date for PT Re-Evaluation 11/21/23    Authorization Type [employer]    PT Start Time 1633    PT Stop Time 1713    PT Time Calculation (min) 40 min    Activity Tolerance Patient tolerated treatment well    Behavior During Therapy [facility] for tasks assessed/performed              Past Medical History:  Diagnosis Date   Cancer (HCC)    Thyroid   Hyperlipemia    Hypertension    Insomnia    Osteoporosis    Personal history of radiation therapy    for thyroid   Reflux    Thyroid disease    Vitamin D deficiency    Past Surgical History:  Procedure Laterality Date   ABDOMINAL HYSTERECTOMY     BREAST SURGERY     BUNIONECTOMY Right    COLONOSCOPY     COLONOSCOPY N/A 04/16/2022   Procedure: COLONOSCOPY;  Surgeon: Jaynie Collins, DO;  Location: [facility] ENDOSCOPY;  Service: Gastroenterology;  Laterality: N/A;   COLONOSCOPY WITH PROPOFOL N/A 06/18/2020   Procedure: COLONOSCOPY WITH PROPOFOL;  Surgeon: Toledo, Boykin Nearing, MD;  Location: [facility] ENDOSCOPY;  Service: Gastroenterology;  Laterality: N/A;   ESOPHAGOGASTRODUODENOSCOPY (EGD) WITH PROPOFOL N/A 06/18/2020   Procedure: ESOPHAGOGASTRODUODENOSCOPY (EGD) WITH PROPOFOL;  Surgeon: Toledo, Boykin Nearing, MD;  Location: [facility] ENDOSCOPY;  Service: Gastroenterology;  Laterality: N/A;   ESOPHAGOGASTRODUODENOSCOPY (EGD) WITH PROPOFOL N/A 04/15/2022   Procedure: ESOPHAGOGASTRODUODENOSCOPY (EGD) WITH PROPOFOL;  Surgeon: Toney Reil, MD;  Location: [facility] ENDOSCOPY;  Service: Gastroenterology;  Laterality: N/A;   HIP ARTHROPLASTY Right    retinopexy Right    THYROIDECTOMY     TONSILLECTOMY     Patient Active Problem List   Diagnosis Date Noted   Diverticulosis of large  intestine with hemorrhage    Gastroesophageal reflux disease without esophagitis 04/15/2022   Other specified hypothyroidism    Rectal bleeding 04/14/2022   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   Borderline high serum cholesterol 06/24/2015   BP (high blood pressure) 06/24/2015   Cannot sleep 06/24/2015   Carcinoma of thyroid (HCC) 06/24/2015   Reflux 06/24/2015   Osteoporosis 06/24/2015   Arthritis, degenerative 06/24/2015   Abdominal pain 05/01/2014   Constipation 05/01/2014   Diarrhea 05/01/2014   Foreign body of rectum 05/01/2014   Avitaminosis D 10/24/2012   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 Unknown Foley, MD  FOLLOW-UP APPT SCHEDULED WITH REFERRING PROVIDER: Yes ; f/u with surgeon 09/13/23  PERTINENT HISTORY: Pt is an 82 year old female s/p L TKA 08/01/23. Patient reports lot of swelling. Patient reports no significant wound care issues. Pt reports no home health PT since surgery. Patient reports hx of diabetic neuropathy with some numbness in foot/toes. Pt has some numbness around incision. Past Med hx of HTN, osteoporosis (  lower bone density in lumbar spine mainly per pt), OA, post-operative hypothyroidism. Pt reports pain is fairly well controlled with Tramadol; 2x/day and pt is also using Tylenol. Pt reports mild pain at rest. Pt reports doing well with car transfers, getting in/out of house, getting around house. Hx of R THA.  PAIN:   Pain Intensity: Present: 3-4/10, Best: 1/10, Worst: 8/10 Pain location: medial knee complex, along "path of  incision" Pain quality:  burning, throbbing   Swelling: Yes , L knee and leg Numbness/Tingling: Yes; some numbness next to incision, neuropathy in feet  Focal weakness or buckling: No, pt able to ambulate with FWW  Aggravating factors: riding in car, bending knee, transfers e.g. up/down from chair  Relieving factors: Tramadol, Tylenol, cold pack/ice machine   History of prior back, hip, or knee injury, pain, surgery, or therapy: No  Imaging: Yes ; good post-op radiographs   Prior level of function: Independent with community mobility with device Occupational demands: Retired  Presenter, broadcasting: Back to walking exercise program, DIY projects around house  Red flags: Negative for personal history of cancer, chills/fever, night sweats, nausea, vomiting, unexplained weight gain/loss, unrelenting pain  PRECAUTIONS: None  WEIGHT BEARING RESTRICTIONS:  WBAT  FALLS: Has patient fallen in last 6 months? No  Living Environment Lives with: lives with their spouse and her son  Lives in: House/apartment Built in seat shower, safety strips on shower floor, suction grab bar; raised commode. Only small threshold into home from garage. They have upstairs, but everything she uses is on first level.    Patient Goals: return to normal walking, walking routine (up to 1 mi previously, 2 years ago); able to return to exercise classes (e.g. chair aerobics); able to travel (further in car)   OBJECTIVE:   GAIT: Comments: With FWW, Dec L knee terminal knee extension at terminal swing, good heel to toe progression and normal step length, moderate forward lean onto front-wheeled walker, mild dec stance time LLE   AROM AROM (Normal range in degrees) AROM 08/21/23 AROM 09/27/23 AROM 10/23/23  Hip Right Left Right Left Right Left  Le        Knee        Flexion (135) WNL 57  94 WNL 115  Extension (0) WNL -8  -2 WNL -2          Ankle        Dorsiflexion (20)  WNL      Plantarflexion (50)  WNL      Inversion  (35)        Eversion (15        (* = pain; Blank rows = not tested)   L knee PROM: -5-78 09/27/23: -1-108 10/23/23: -1-119   LE MMT: MMT (out of 5) Right 08/21/23 Left 08/21/23 Right 09/27/23 Left 09/27/23 Right 10/23/23 Left 10/23/23  Hip flexion 4 3+ 4 4 4  4+  Hip extension        Hip abduction (seated) 4+ 4+ 4+ 4+ 5 5  Hip adduction (seated) 4+ 4+ 5 5 5 5   Hip internal rotation        Hip external rotation        Knee flexion 4+ 4- 5 5 4+ 5  Knee extension 4+ 4 5 4+ 5 4+  Ankle dorsiflexion        Ankle plantarflexion        Ankle inversion        Ankle eversion        (* =  pain; Blank rows = not tested)      TODAY'S TREATMENT:   SUBJECTIVE STATEMENT:   Patient reports notable discomfort in L hip/groin for about 3 days after last visit. She feels that her LLE is doing "okay" at arrival. She reports remaining discomfort in her L knee - difficulty with trying to sleep. Pt reports using OTC medication and Tramadol prn as night time for getting to sleep; pt also uses ice regularly (including at night).     Manual Therapy - for symptom modulation, soft tissue sensitivity and mobility, joint mobility, ROM    LLE long-leg distraction, [place] grade II for pain relief for L knee and hip; x 10 sec holds, intermittent; x 3 minutes L knee PROM within patient tolerance in supine and sitting (for flexion and extension) x 15 reps  Patellar mobilization, gr III; emphasis on superior and inferior glides; 3 x 30 sec bouts   L knee AROM measurement: -2-115  *not today* TF posterior mobilization with pt in hooklying; gr III for knee flexion mobility at end-range; 2 x 30 sec bouts in hooklying Passive stretch into flexion over edge of table, seated; 1 x 30 sec    Therapeutic Exercise - for improved soft tissue flexibility and extensibility as needed for ROM, improved strength as needed to improve performance of CKC activities/functional movements   NuStep, seat at 5 for  increased knee flexion; Level 5, x 5 minutes - for L knee AAROM  -subjective information gathered during this time  Bridge; 2x10  // bars: dynamic march; 5x D/B length of bars // bars: Minisquat; 2x10, 0-45 deg  -verbal cueing and demo for technique to avoid excessive anterior tibial translation // bars, obstacle course: 3 6-inch hurdles, 2 Airex pads; 4x D/B  Forward step up, 6-inch step; 1x10 Forward step up to opposite LE follow-through; 6-inch step; 1x10    PATIENT EDUCATION: Discussed current progress and expectations with remainder of PT visits.     *not today* SLR; 2x10; 2-lb ankle weight Sit to stand; 2 x 10 with 6-lb Medball goblet hold  Dynamic march along blue agility ladder; 4x D/B   -PT SBA Supine heel slide, seated with bed sheet; x10, 5 sec hold  Knee flexion stretch on staircase, forward lunge; x5, 10 sec hold Forward and retro steps along blue agility ladder for terminal knee extension; 5x D/B with SPC RUE Cold pack (unbilled) - for anti-inflammatory and analgesic effect as needed for reduced pain and improved ability to participate in active PT intervention, along L knee in supine with pillow behind calf, x 5 minutes Standing march adjacent to treadmill armrest for UE support; x20 alternating R/L, for weight shifting and knee ROM Ambulate laps around gym; x 4 laps  -subjective information gathered Short arc quad, with large blue bolster today; 2x10, 3 sec Ambulate laps with SPC RUE; x 2 laps  -Demo from PT for [facility] gait technique and task practice with pt, PT standby assist Quad set, small rolled up sheet behind knee for tactile input; reviewed for HEP, 1x10    PATIENT EDUCATION:  Education details: see above for patient education details  Person educated: Patient and Spouse Education method: Explanation, Demonstration, and Handouts Education comprehension: verbalized understanding and returned demonstration   HOME EXERCISE PROGRAM:  Access Code:  [email or web address] URL: [email or web address] Date: 10/16/2023 Prepared by: Consuela Mimes  Exercises - Supine Quad Set  - 3 x daily - 7 x weekly - 2 sets - 10 reps - 5sec hold - Supine Heel  Slide with Strap  - 3 x daily - 7 x weekly - 2 sets - 10 reps - 5 sec hold - Supine Active Straight Leg Raise  - 2 x daily - 7 x weekly - 2 sets - 10 reps - Seated Knee Flexion Slide  - 2 x daily - 7 x weekly - 2 sets - 10 reps - 5sec hold - Sit to Stand Without Arm Support  - 2 x daily - 7 x weekly - 2 sets - 8-10 reps   ASSESSMENT:  CLINICAL IMPRESSION: Exercises were modified to reduce load on hip flexors (e.g. loaded SLR) and loading into CKC flexion to large degree (e.g. sit to stand with Medball Goblet hold). Pt exhibits good knee ROM at this time. She does exhibit mild L knee stiffness with gait with decreased knee flexion during mid-swing and mild dec terminal knee extension at terminal swing. Patient has remaining post-op impairments in: mild end-range L knee flexion/extension ROM deficits, L knee stiffness, post-operative pain, and activity limitations with prolonged standing/walking and obstacle/stair negotiation. Pt will benefit from skilled PT services to address deficits for best return to PLOF.    OBJECTIVE IMPAIRMENTS: Abnormal gait, decreased activity tolerance, decreased mobility, difficulty walking, decreased ROM, decreased strength, hypomobility, increased edema, impaired flexibility, and pain.   ACTIVITY LIMITATIONS: bending, sitting, standing, squatting, stairs, transfers, bed mobility, toileting, and locomotion level  PARTICIPATION LIMITATIONS: meal prep, cleaning, driving, shopping, and community activity  PERSONAL FACTORS: Age and 3+ comorbidities: (HTN, hx of thyroid cancer and post-operative hypothyroidism, GI dysfunction)  are also affecting patient's functional outcome.   REHAB POTENTIAL: Excellent  CLINICAL DECISION MAKING: Evolving/moderate complexity  EVALUATION  COMPLEXITY: Moderate   GOALS: Goals reviewed with patient? Yes  SHORT TERM GOALS: Target date: 09/11/2023  Pt will be independent with HEP to improve strength and decrease knee pain to improve pain-free function at home and work. Baseline: 08/21/23: Baseline HEP reviewed/MedBridge handout provided.      09/27/23: Pt reports completing her HEP each day, sometimes completing extra reps and sometimes doing slightly less.  Goal status: ACHIEVED  Pt will improve L knee AROM to 0-100 in first 3-4 weeks of PT as needed for improved ROM needed for transferring, self-care ADLs Baseline: 08/21/23: L knee AROM -8-57.    09/27/23: L knee AROM -2-94.    10/23/23: L knee AROM -2-115.  Goal status: IN PROGRESS/MOSTLY MET    LONG TERM GOALS: Target date: 10/19/2023  Pt will increase FOTO to at least 58 to demonstrate significant improvement in function at home and work related to knee pain  Baseline: 08/21/23: 37.       09/27/23: 47/58.    10/23/23: 53/58 Goal status: IN PROGRESS  2.  Pt will decrease worst knee pain by at least 3 points on the NPRS in order to demonstrate clinically significant reduction in knee pain. Baseline: 08/21/23: 8/10 pain at worst      09/27/23: 3-4/10 at worst with ADLs/activity.  Goal status: ACHIEVED   3.  Pt will have L knee AROM 0-120 indicative of full ROM as needed for completion of LE management, self-care ADLs, and transfers from various-height surfaces.    Baseline: 08/21/23: -8-57.      09/27/23: AROM -2-94, PROM -1-107      10/23/23: L knee AROM -2-115, PROM -1-119 Goal status: IN PROGRESS/MOSTLY MET   4.  Pt will increase strength of tested lower extremity musculature to at least 4+/5 or greater MMT grade in order to demonstrate improvement  in strength and function  Baseline: 08/21/23: LLE strength 3- to 4/5 with exception of modified hip ABD/ADD MMT in sitting.   09/27/23: 4+ for all muscles tested with exception of hip flexion.    10/23/23: 4+/5 or greater for LE  Goal  status: ACHIEVED   5.  Pt will ambulate without AD at community-level distance (660 ft or greater) without LOB, symmetrical weightbearing, and no major gait deviations  Baseline: 08/21/23: Limited capacity for community-level gait, use of FWW primarily.     09/27/23: Pt able to ambulate 575 feet, developed discomfort in R hip and L foot from slip-on footwear that is not supportive and higher volume of stepping, dec velocity of LLE swing during swing phase.      10/23/23: Pt able to ambulate > 660 ft with no significant gait deviations.  Goal status: ACHIEVED   PLAN: PT FREQUENCY: 1-2x/week  PT DURATION: 3-4 weeks  PLANNED INTERVENTIONS: Therapeutic exercises, Therapeutic activity, Neuromuscular re-education, Balance training, Gait training, Patient/Family education, Self Care, Joint mobilization, Joint manipulation, Vestibular training, Canalith repositioning, Orthotic/Fit training, DME instructions, Dry Needling, Electrical stimulation, Spinal manipulation, Spinal mobilization, Cryotherapy, Moist heat, Taping, Traction, Ultrasound, Ionotophoresis 4mg /ml Dexamethasone, Manual therapy, and Re-evaluation.  PLAN FOR NEXT SESSION: Progress with end-range L knee ROM for flexion/extension. Continue with progressive weightbearing functional activities and emphasis on CKC strengthening prior to discharge.   Consuela Mimes, PT, DPT #[id number]  Gertie Exon, PT 11/01/2023, 4:34 PM

## 2023-11-06 ENCOUNTER — Ambulatory Visit: Payer: Medicare HMO | Admitting: Physical Therapy

## 2023-11-06 ENCOUNTER — Encounter: Payer: Self-pay | Admitting: Physical Therapy

## 2023-11-06 DIAGNOSIS — M6281 Muscle weakness (generalized): Secondary | ICD-10-CM

## 2023-11-06 DIAGNOSIS — R262 Difficulty in walking, not elsewhere classified: Secondary | ICD-10-CM

## 2023-11-06 DIAGNOSIS — M25662 Stiffness of left knee, not elsewhere classified: Secondary | ICD-10-CM

## 2023-11-06 DIAGNOSIS — M25562 Pain in left knee: Secondary | ICD-10-CM | POA: Diagnosis not present

## 2023-11-06 NOTE — Therapy (Signed)
OUTPATIENT PHYSICAL THERAPY TREATMENT AND PROGRESS NOTE   Dates of reporting period  09/27/23   to   11/06/23   Patient Name: Alisha Stevens MRN: [id number] DOB:1941/10/26, 82 y.o., female Today's Date: 11/06/2023   END OF SESSION:  PT End of Session - 11/06/23 0948     Visit Number 20    Number of Visits 25    Date for PT Re-Evaluation 11/21/23    Authorization Type [employer]    PT Start Time [phone]    PT Stop Time 1027    PT Time Calculation (min) 40 min    Activity Tolerance Patient tolerated treatment well    Behavior During Therapy [facility] for tasks assessed/performed               Past Medical History:  Diagnosis Date   Cancer (HCC)    Thyroid   Hyperlipemia    Hypertension    Insomnia    Osteoporosis    Personal history of radiation therapy    for thyroid   Reflux    Thyroid disease    Vitamin D deficiency    Past Surgical History:  Procedure Laterality Date   ABDOMINAL HYSTERECTOMY     BREAST SURGERY     BUNIONECTOMY Right    COLONOSCOPY     COLONOSCOPY N/A 04/16/2022   Procedure: COLONOSCOPY;  Surgeon: Jaynie Collins, DO;  Location: [facility] ENDOSCOPY;  Service: Gastroenterology;  Laterality: N/A;   COLONOSCOPY WITH PROPOFOL N/A 06/18/2020   Procedure: COLONOSCOPY WITH PROPOFOL;  Surgeon: Toledo, Boykin Nearing, MD;  Location: [facility] ENDOSCOPY;  Service: Gastroenterology;  Laterality: N/A;   ESOPHAGOGASTRODUODENOSCOPY (EGD) WITH PROPOFOL N/A 06/18/2020   Procedure: ESOPHAGOGASTRODUODENOSCOPY (EGD) WITH PROPOFOL;  Surgeon: Toledo, Boykin Nearing, MD;  Location: [facility] ENDOSCOPY;  Service: Gastroenterology;  Laterality: N/A;   ESOPHAGOGASTRODUODENOSCOPY (EGD) WITH PROPOFOL N/A 04/15/2022   Procedure: ESOPHAGOGASTRODUODENOSCOPY (EGD) WITH PROPOFOL;  Surgeon: Toney Reil, MD;  Location: [facility] ENDOSCOPY;  Service: Gastroenterology;  Laterality: N/A;   HIP ARTHROPLASTY Right    retinopexy Right    THYROIDECTOMY     TONSILLECTOMY     Patient  Active Problem List   Diagnosis Date Noted   Diverticulosis of large intestine with hemorrhage    Gastroesophageal reflux disease without esophagitis 04/15/2022   Other specified hypothyroidism    Rectal bleeding 04/14/2022   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   Borderline high serum cholesterol 06/24/2015   BP (high blood pressure) 06/24/2015   Cannot sleep 06/24/2015   Carcinoma of thyroid (HCC) 06/24/2015   Reflux 06/24/2015   Osteoporosis 06/24/2015   Arthritis, degenerative 06/24/2015   Abdominal pain 05/01/2014   Constipation 05/01/2014   Diarrhea 05/01/2014   Foreign body of rectum 05/01/2014   Avitaminosis D 10/24/2012   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 Unknown Foley, MD  FOLLOW-UP APPT SCHEDULED WITH REFERRING PROVIDER: Yes ; f/u with surgeon 09/13/23  PERTINENT HISTORY: Pt is an 82 year old female s/p L TKA 08/01/23. Patient reports lot of swelling. Patient reports no significant wound care issues. Pt reports no home health PT since surgery. Patient reports hx of  diabetic neuropathy with some numbness in foot/toes. Pt has some numbness around incision. Past Med hx of HTN, osteoporosis (lower bone density in lumbar spine mainly per pt), OA, post-operative hypothyroidism. Pt reports pain is fairly well controlled with Tramadol; 2x/day and pt is also using Tylenol. Pt reports mild pain at rest. Pt reports doing well with car transfers, getting in/out of house, getting around house. Hx of R THA.  PAIN:   Pain Intensity: Present: 3-4/10, Best:  1/10, Worst: 8/10 Pain location: medial knee complex, along "path of incision" Pain quality:  burning, throbbing   Swelling: Yes , L knee and leg Numbness/Tingling: Yes; some numbness next to incision, neuropathy in feet  Focal weakness or buckling: No, pt able to ambulate with FWW  Aggravating factors: riding in car, bending knee, transfers e.g. up/down from chair  Relieving factors: Tramadol, Tylenol, cold pack/ice machine   History of prior back, hip, or knee injury, pain, surgery, or therapy: No  Imaging: Yes ; good post-op radiographs   Prior level of function: Independent with community mobility with device Occupational demands: Retired  Presenter, broadcasting: Back to walking exercise program, DIY projects around house  Red flags: Negative for personal history of cancer, chills/fever, night sweats, nausea, vomiting, unexplained weight gain/loss, unrelenting pain  PRECAUTIONS: None  WEIGHT BEARING RESTRICTIONS:  WBAT  FALLS: Has patient fallen in last 6 months? No  Living Environment Lives with: lives with their spouse and her son  Lives in: House/apartment Built in seat shower, safety strips on shower floor, suction grab bar; raised commode. Only small threshold into home from garage. They have upstairs, but everything she uses is on first level.    Patient Goals: return to normal walking, walking routine (up to 1 mi previously, 2 years ago); able to return to exercise classes (e.g. chair aerobics); able to travel (further in car)   OBJECTIVE:   GAIT: Comments: With FWW, Dec L knee terminal knee extension at terminal swing, good heel to toe progression and normal step length, moderate forward lean onto front-wheeled walker, mild dec stance time LLE   AROM AROM (Normal range in degrees) AROM 08/21/23 AROM 09/27/23 AROM 10/23/23 AROM 11/06/23  Hip Right Left Right Left Right Left Right Left  Le          Knee          Flexion (135) WNL 57  94 WNL 115 WNL 120  Extension (0) WNL  -8  -2 WNL -2 WNL -2            Ankle          Dorsiflexion (20)  WNL        Plantarflexion (50)  WNL        Inversion (35)          Eversion (15          (* = pain; Blank rows = not tested)   L knee PROM: -5-78 09/27/23: -1-108 10/23/23: -1-119 11/06/23: -1-122   LE MMT: MMT (out of 5) Right 08/21/23 Left 08/21/23 Right 09/27/23 Left 09/27/23 Right 10/23/23 Left 10/23/23  Hip flexion 4 3+ 4 4 4  4+  Hip extension        Hip abduction (seated) 4+ 4+ 4+ 4+ 5 5  Hip adduction (seated) 4+ 4+ 5 5 5 5   Hip internal rotation        Hip external rotation        Knee flexion 4+ 4- 5 5 4+ 5  Knee extension  4+ 4 5 4+ 5 4+  Ankle dorsiflexion        Ankle plantarflexion        Ankle inversion        Ankle eversion        (* = pain; Blank rows = not tested)      TODAY'S TREATMENT:   SUBJECTIVE STATEMENT:   Patient reports she has "ways to go" for getting back to her normal. Patient reports 60% global rating of function. Patient reports she is taking longer to complete daily activities. Patient reports stairs are "not that bad" at this time. Pt feels that she can stand for long time, but she pays for it with pain after the fact. Pt reports she is limited with lifting weights and with completing squatting. Pt feels she is still limited with squatting activity. Pt reports still being challenged with traveling for prolonged period.    Manual Therapy - for symptom modulation, soft tissue sensitivity and mobility, joint mobility, ROM    TF posterior mobilization with pt in hooklying; gr III for knee flexion mobility at end-range; 2 x 30 sec bouts in hooklying L knee extension mobilization; x 10 bouts L knee PROM within patient tolerance in supine (for flexion and extension); x 15 reps  Patellar mobilization, gr III; emphasis on superior and inferior glides; 2 x 30 sec bouts    *not today* LLE long-leg distraction, [place] grade II for pain relief for L knee and hip; x 10 sec  holds, intermittent; x 3 minutes Passive stretch into flexion over edge of table, seated; 1 x 30 sec    Therapeutic Exercise - for improved soft tissue flexibility and extensibility as needed for ROM, improved strength as needed to improve performance of CKC activities/functional movements   *GOAL UPDATE PERFORMED   NuStep, seat at 5 for increased knee flexion; Level 5, x 5 minutes - for L knee AAROM  -subjective information gathered during this time  Bridge; 2x10, 10-lb ankle weight across lap;  Standing terminal knee extension with Green Tband (anchored onto treadmill); 2 x 15  Ambulate laps around gym x 3 with focus on full knee extension at terminal swing and heel striking  -verbal cueing and demo for technique   PATIENT EDUCATION: Discussed current progress with PT, goals met, and functional needs remaining for PT.     *next visit* // bars: dynamic march; 5x D/B length of bars // bars: Minisquat; 2x10, 0-45 deg  -verbal cueing and demo for technique to avoid excessive anterior tibial translation // bars, obstacle course: 3 6-inch hurdles, 2 Airex pads; 4x D/B  Forward step up, 6-inch step; 1x10 Forward step up to opposite LE follow-through; 6-inch step; 1x10    *not today* SLR; 2x10; 2-lb ankle weight Sit to stand; 2 x 10 with 6-lb Medball goblet hold  Dynamic march along blue agility ladder; 4x D/B   -PT SBA Supine heel slide, seated with bed sheet; x10, 5 sec hold  Knee flexion stretch on staircase, forward lunge; x5, 10 sec hold Forward and retro steps along blue agility ladder for terminal knee extension; 5x D/B with SPC RUE Cold pack (unbilled) - for anti-inflammatory and analgesic effect as needed for reduced pain and improved ability to participate in active PT intervention, along L knee in supine with pillow behind calf, x 5 minutes Standing march adjacent to treadmill armrest for UE support; x20 alternating R/L, for weight shifting and knee ROM Ambulate  laps around gym; x 4 laps  -subjective  information gathered Short arc quad, with large blue bolster today; 2x10, 3 sec Ambulate laps with SPC RUE; x 2 laps  -Demo from PT for [facility] gait technique and task practice with pt, PT standby assist Quad set, small rolled up sheet behind knee for tactile input; reviewed for HEP, 1x10    PATIENT EDUCATION:  Education details: see above for patient education details  Person educated: Patient and Spouse Education method: Explanation, Demonstration, and Handouts Education comprehension: verbalized understanding and returned demonstration   HOME EXERCISE PROGRAM:  Access Code: [email or web address] URL: [email or web address] Date: 10/16/2023 Prepared by: Consuela Mimes  Exercises - Supine Quad Set  - 3 x daily - 7 x weekly - 2 sets - 10 reps - 5sec hold - Supine Heel Slide with Strap  - 3 x daily - 7 x weekly - 2 sets - 10 reps - 5 sec hold - Supine Active Straight Leg Raise  - 2 x daily - 7 x weekly - 2 sets - 10 reps - Seated Knee Flexion Slide  - 2 x daily - 7 x weekly - 2 sets - 10 reps - 5sec hold - Sit to Stand Without Arm Support  - 2 x daily - 7 x weekly - 2 sets - 8-10 reps   ASSESSMENT:  CLINICAL IMPRESSION: Patient has largely met post-op rehab goals. Relative to her pre-morbid high level of function, she still feels that she has quite a way to go to return to her PLOF. She is concerned with difficulty with tolerating prolonged standing/walking/weightbearing work and squatting/lifting. She has good L knee ROM at this time and has previously met her strength goal. Her gait has largely improved, and pt is able to ambulate safely at community-level distance. We discussed strategies for improving gait pattern with ensuring heel strike and full knee extension at terminal swing. Pt has not yet met FOTO goal.  Patient has remaining post-op impairments in: mild active TKE deficit, L knee stiffness, post-operative pain, and activity limitations  with prolonged standing/walking and squatting/lifting. Pt will benefit from skilled PT services to address deficits for best return to PLOF.    OBJECTIVE IMPAIRMENTS: Abnormal gait, decreased activity tolerance, decreased mobility, difficulty walking, decreased ROM, decreased strength, hypomobility, increased edema, impaired flexibility, and pain.   ACTIVITY LIMITATIONS: bending, sitting, standing, squatting, stairs, transfers, bed mobility, toileting, and locomotion level  PARTICIPATION LIMITATIONS: meal prep, cleaning, driving, shopping, and community activity  PERSONAL FACTORS: Age and 3+ comorbidities: (HTN, hx of thyroid cancer and post-operative hypothyroidism, GI dysfunction)  are also affecting patient's functional outcome.   REHAB POTENTIAL: Excellent  CLINICAL DECISION MAKING: Evolving/moderate complexity  EVALUATION COMPLEXITY: Moderate   GOALS: Goals reviewed with patient? Yes  SHORT TERM GOALS: Target date: 09/11/2023  Pt will be independent with HEP to improve strength and decrease knee pain to improve pain-free function at home and work. Baseline: 08/21/23: Baseline HEP reviewed/MedBridge handout provided.      09/27/23: Pt reports completing her HEP each day, sometimes completing extra reps and sometimes doing slightly less.  Goal status: ACHIEVED  Pt will improve L knee AROM to 0-100 in first 3-4 weeks of PT as needed for improved ROM needed for transferring, self-care ADLs Baseline: 08/21/23: L knee AROM -8-57.    09/27/23: L knee AROM -2-94.    10/23/23: L knee AROM -2-115.    11/06/23: L knee AROM -2-120 Goal status: MOSTLY MET    LONG TERM GOALS: Target date: 10/19/2023  Pt will increase FOTO to at least 58  to demonstrate significant improvement in function at home and work related to knee pain  Baseline: 08/21/23: 37.       09/27/23: 47/58.    10/23/23: 53/58.    11/06/23: 52/58.  Goal status: IN PROGRESS  2.  Pt will decrease worst knee pain by at least 3 points  on the NPRS in order to demonstrate clinically significant reduction in knee pain. Baseline: 08/21/23: 8/10 pain at worst      09/27/23: 3-4/10 at worst with ADLs/activity.  Goal status: ACHIEVED   3.  Pt will have L knee AROM 0-120 indicative of full ROM as needed for completion of LE management, self-care ADLs, and transfers from various-height surfaces.    Baseline: 08/21/23: -8-57.  09/27/23: AROM -2-94, PROM -1-107    10/23/23: L knee AROM -2-115, PROM -1-119.  11/06/23: L knee AROM -2-120, PROM -1-122.    Goal status: IN PROGRESS/MOSTLY MET   4.  Pt will increase strength of tested lower extremity musculature to at least 4+/5 or greater MMT grade in order to demonstrate improvement in strength and function  Baseline: 08/21/23: LLE strength 3- to 4/5 with exception of modified hip ABD/ADD MMT in sitting.   09/27/23: 4+ for all muscles tested with exception of hip flexion.    10/23/23: 4+/5 or greater for LE  Goal status: ACHIEVED   5.  Pt will ambulate without AD at community-level distance (660 ft or greater) without LOB, symmetrical weightbearing, and no major gait deviations  Baseline: 08/21/23: Limited capacity for community-level gait, use of FWW primarily.     09/27/23: Pt able to ambulate 575 feet, developed discomfort in R hip and L foot from slip-on footwear that is not supportive and higher volume of stepping, dec velocity of LLE swing during swing phase.      10/23/23: Pt able to ambulate > 660 ft with no significant gait deviations.  Goal status: ACHIEVED   PLAN: PT FREQUENCY: 1-2x/week  PT DURATION: 3-4 weeks  PLANNED INTERVENTIONS: Therapeutic exercises, Therapeutic activity, Neuromuscular re-education, Balance training, Gait training, Patient/Family education, Self Care, Joint mobilization, Joint manipulation, Vestibular training, Canalith repositioning, Orthotic/Fit training, DME instructions, Dry Needling, Electrical stimulation, Spinal manipulation, Spinal mobilization, Cryotherapy,  Moist heat, Taping, Traction, Ultrasound, Ionotophoresis 4mg /ml Dexamethasone, Manual therapy, and Re-evaluation.  PLAN FOR NEXT SESSION: Continue with advanced strengthening, CKC drills e.g. step-up, step-down, obstacle negotiation, compliant surfaces. Strategies for pain control or PF/TF joint mobility prn.   Consuela Mimes, PT, DPT #[id number]  Gertie Exon, PT 11/06/2023, 9:48 AM

## 2023-11-08 ENCOUNTER — Encounter: Payer: Medicare HMO | Admitting: Physical Therapy

## 2023-11-09 ENCOUNTER — Ambulatory Visit: Payer: Medicare HMO | Admitting: Physical Therapy

## 2023-11-09 NOTE — Therapy (Deleted)
OUTPATIENT PHYSICAL THERAPY TREATMENT    Patient Name: Alisha Stevens MRN: [id number] DOB:[birth year], 82 y.o., female Today's Date: 11/09/2023   END OF SESSION:      Past Medical History:  Diagnosis Date   Cancer [facility])    Thyroid   Hyperlipemia    Hypertension    Insomnia    Osteoporosis    Personal history of radiation therapy    for thyroid   Reflux    Thyroid disease    Vitamin D deficiency    Past Surgical History:  Procedure Laterality Date   ABDOMINAL HYSTERECTOMY     BREAST SURGERY     BUNIONECTOMY Right    COLONOSCOPY     COLONOSCOPY N/A 04/16/2022   Procedure: COLONOSCOPY;  Surgeon: Jaynie Collins, DO;  Location: [facility] ENDOSCOPY;  Service: Gastroenterology;  Laterality: N/A;   COLONOSCOPY WITH PROPOFOL N/A 06/18/2020   Procedure: COLONOSCOPY WITH PROPOFOL;  Surgeon: Toledo, Boykin Nearing, MD;  Location: [facility] ENDOSCOPY;  Service: Gastroenterology;  Laterality: N/A;   ESOPHAGOGASTRODUODENOSCOPY (EGD) WITH PROPOFOL N/A 06/18/2020   Procedure: ESOPHAGOGASTRODUODENOSCOPY (EGD) WITH PROPOFOL;  Surgeon: Toledo, Boykin Nearing, MD;  Location: [facility] ENDOSCOPY;  Service: Gastroenterology;  Laterality: N/A;   ESOPHAGOGASTRODUODENOSCOPY (EGD) WITH PROPOFOL N/A 04/15/2022   Procedure: ESOPHAGOGASTRODUODENOSCOPY (EGD) WITH PROPOFOL;  Surgeon: Toney Reil, MD;  Location: [facility] ENDOSCOPY;  Service: Gastroenterology;  Laterality: N/A;   HIP ARTHROPLASTY Right    retinopexy Right    THYROIDECTOMY     TONSILLECTOMY     Patient Active Problem List   Diagnosis Date Noted   Diverticulosis of large intestine with hemorrhage    Gastroesophageal reflux disease without esophagitis 04/15/2022   Other specified hypothyroidism    Rectal bleeding 04/14/2022   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   Borderline high serum cholesterol 06/24/2015   BP (high blood pressure) 06/24/2015   Cannot sleep 06/24/2015   Carcinoma of thyroid (HCC) 06/24/2015   Reflux  06/24/2015   Osteoporosis 06/24/2015   Arthritis, degenerative 06/24/2015   Abdominal pain 05/01/2014   Constipation 05/01/2014   Diarrhea 05/01/2014   Foreign body of rectum 05/01/2014   Avitaminosis D 10/24/2012   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 Unknown Foley, MD  FOLLOW-UP APPT SCHEDULED WITH REFERRING PROVIDER: Yes ; f/u with surgeon 09/13/23  PERTINENT HISTORY: Pt is an 82 year old female s/p L TKA 08/01/23. Patient reports lot of swelling. Patient reports no significant wound care issues. Pt reports no home health PT since surgery. Patient reports hx of diabetic neuropathy with some numbness in foot/toes. Pt has some numbness around incision. Past Med hx of HTN, osteoporosis (lower bone density in lumbar spine mainly per pt), OA, post-operative hypothyroidism. Pt reports pain is fairly well controlled with Tramadol; 2x/day and pt is also using Tylenol. Pt reports mild pain at rest. Pt reports doing well with car transfers, getting in/out of house, getting around house. Hx of R THA.  PAIN:   Pain Intensity: Present: 3-4/10, Best: 1/10, Worst: 8/10 Pain location: medial knee complex, along "path of incision" Pain quality:  burning, throbbing   Swelling: Yes , L knee  and leg Numbness/Tingling: Yes; some numbness next to incision, neuropathy in feet  Focal weakness or buckling: No, pt able to ambulate with FWW  Aggravating factors: riding in car, bending knee, transfers e.g. up/down from chair  Relieving factors: Tramadol, Tylenol, cold pack/ice machine    History of prior back, hip, or knee injury, pain, surgery, or therapy: No  Imaging: Yes ; good post-op radiographs   Prior level of function: Independent with community mobility with device Occupational demands: Retired  Presenter, broadcasting: Back to walking exercise program, DIY projects around house  Red flags: Negative for personal history of cancer, chills/fever, night sweats, nausea, vomiting, unexplained weight gain/loss, unrelenting pain  PRECAUTIONS: None  WEIGHT BEARING RESTRICTIONS:  WBAT  FALLS: Has patient fallen in last 6 months? No  Living Environment Lives with: lives with their spouse and her son  Lives in: House/apartment Built in seat shower, safety strips on shower floor, suction grab bar; raised commode. Only small threshold into home from garage. They have upstairs, but everything she uses is on first level.    Patient Goals: return to normal walking, walking routine (up to 1 mi previously, 2 years ago); able to return to exercise classes (e.g. chair aerobics); able to travel (further in car)   OBJECTIVE:   GAIT: Comments: With FWW, Dec L knee terminal knee extension at terminal swing, good heel to toe progression and normal step length, moderate forward lean onto front-wheeled walker, mild dec stance time LLE   AROM AROM (Normal range in degrees) AROM 08/21/23 AROM 09/27/23 AROM 10/23/23 AROM 11/06/23  Hip Right Left Right Left Right Left Right Left  Le          Knee          Flexion (135) WNL 57  94 WNL 115 WNL 120  Extension (0) WNL -8  -2 WNL -2 WNL -2            Ankle          Dorsiflexion (20)  WNL        Plantarflexion (50)  WNL        Inversion (35)          Eversion (15          (* = pain; Blank rows = not tested)   L knee PROM: -5-78 09/27/23: -1-108 10/23/23: -1-119 11/06/23: -1-122   LE MMT: MMT (out of 5) Right 08/21/23 Left 08/21/23 Right 09/27/23 Left 09/27/23 Right 10/23/23 Left 10/23/23  Hip flexion 4 3+ 4 4 4  4+  Hip extension         Hip abduction (seated) 4+ 4+ 4+ 4+ 5 5  Hip adduction (seated) 4+ 4+ 5 5 5 5   Hip internal rotation        Hip external rotation        Knee flexion 4+ 4- 5 5 4+ 5  Knee extension 4+ 4 5 4+ 5 4+  Ankle dorsiflexion        Ankle plantarflexion        Ankle inversion        Ankle eversion        (* = pain; Blank rows = not tested)      TODAY'S TREATMENT:   SUBJECTIVE STATEMENT:   Patient reports she has "ways to go" for getting back to her normal. Patient reports 60% global rating of function. Patient reports she is taking longer to complete daily activities. Patient reports stairs are "not that bad" at this time.  Pt feels that she can stand for long time, but she pays for it with pain after the fact. Pt reports she is limited with lifting weights and with completing squatting. Pt feels she is still limited with squatting activity. Pt reports still being challenged with traveling for prolonged period.    Manual Therapy - for symptom modulation, soft tissue sensitivity and mobility, joint mobility, ROM    TF posterior mobilization with pt in hooklying; gr III for knee flexion mobility at end-range; 2 x 30 sec bouts in hooklying L knee extension mobilization; x 10 bouts L knee PROM within patient tolerance in supine (for flexion and extension); x 15 reps  Patellar mobilization, gr III; emphasis on superior and inferior glides; 2 x 30 sec bouts    *not today* LLE long-leg distraction, [place] grade II for pain relief for L knee and hip; x 10 sec holds, intermittent; x 3 minutes Passive stretch into flexion over edge of table, seated; 1 x 30 sec    Therapeutic Exercise - for improved soft tissue flexibility and extensibility as needed for ROM, improved strength as needed to improve performance of CKC activities/functional movements   NuStep, seat at 5 for increased knee flexion; Level 5, x 5 minutes - for L knee AAROM  -subjective information gathered during this  time  Bridge; 2x10, 10-lb ankle weight across lap;  Standing terminal knee extension with Green Tband (anchored onto treadmill); 2 x 15  Ambulate laps around gym x 3 with focus on full knee extension at terminal swing and heel striking  -verbal cueing and demo for technique   PATIENT EDUCATION: Discussed current progress with PT, goals met, and functional needs remaining for PT.     *next visit* // bars: dynamic march; 5x D/B length of bars // bars: Minisquat; 2x10, 0-45 deg  -verbal cueing and demo for technique to avoid excessive anterior tibial translation // bars, obstacle course: 3 6-inch hurdles, 2 Airex pads; 4x D/B  Forward step up, 6-inch step; 1x10 Forward step up to opposite LE follow-through; 6-inch step; 1x10    *not today* SLR; 2x10; 2-lb ankle weight Sit to stand; 2 x 10 with 6-lb Medball goblet hold  Dynamic march along blue agility ladder; 4x D/B   -PT SBA Supine heel slide, seated with bed sheet; x10, 5 sec hold  Knee flexion stretch on staircase, forward lunge; x5, 10 sec hold Forward and retro steps along blue agility ladder for terminal knee extension; 5x D/B with SPC RUE Cold pack (unbilled) - for anti-inflammatory and analgesic effect as needed for reduced pain and improved ability to participate in active PT intervention, along L knee in supine with pillow behind calf, x 5 minutes Standing march adjacent to treadmill armrest for UE support; x20 alternating R/L, for weight shifting and knee ROM Ambulate laps around gym; x 4 laps  -subjective information gathered Short arc quad, with large blue bolster today; 2x10, 3 sec Ambulate laps with SPC RUE; x 2 laps  -Demo from PT for [facility] gait technique and task practice with pt, PT standby assist Quad set, small rolled up sheet behind knee for tactile input; reviewed for HEP, 1x10    PATIENT EDUCATION:  Education details: see above for patient education details  Person educated: Patient and  Spouse Education method: Explanation, Demonstration, and Handouts Education comprehension: verbalized understanding and returned demonstration   HOME EXERCISE PROGRAM:  Access Code: [email or web address] URL: [email or web address] Date: 10/16/2023 Prepared by: Consuela Mimes  Exercises - Supine Quad Set  -  3 x daily - 7 x weekly - 2 sets - 10 reps - 5sec hold - Supine Heel Slide with Strap  - 3 x daily - 7 x weekly - 2 sets - 10 reps - 5 sec hold - Supine Active Straight Leg Raise  - 2 x daily - 7 x weekly - 2 sets - 10 reps - Seated Knee Flexion Slide  - 2 x daily - 7 x weekly - 2 sets - 10 reps - 5sec hold - Sit to Stand Without Arm Support  - 2 x daily - 7 x weekly - 2 sets - 8-10 reps   ASSESSMENT:  CLINICAL IMPRESSION: Patient has largely met post-op rehab goals. Relative to her pre-morbid high level of function, she still feels that she has quite a way to go to return to her PLOF. She is concerned with difficulty with tolerating prolonged standing/walking/weightbearing work and squatting/lifting. She has good L knee ROM at this time and has previously met her strength goal. Her gait has largely improved, and pt is able to ambulate safely at community-level distance. We discussed strategies for improving gait pattern with ensuring heel strike and full knee extension at terminal swing. Pt has not yet met FOTO goal.  Patient has remaining post-op impairments in: mild active TKE deficit, L knee stiffness, post-operative pain, and activity limitations with prolonged standing/walking and squatting/lifting. Pt will benefit from skilled PT services to address deficits for best return to PLOF.    OBJECTIVE IMPAIRMENTS: Abnormal gait, decreased activity tolerance, decreased mobility, difficulty walking, decreased ROM, decreased strength, hypomobility, increased edema, impaired flexibility, and pain.   ACTIVITY LIMITATIONS: bending, sitting, standing, squatting, stairs, transfers, bed  mobility, toileting, and locomotion level  PARTICIPATION LIMITATIONS: meal prep, cleaning, driving, shopping, and community activity  PERSONAL FACTORS: Age and 3+ comorbidities: (HTN, hx of thyroid cancer and post-operative hypothyroidism, GI dysfunction)  are also affecting patient's functional outcome.   REHAB POTENTIAL: Excellent  CLINICAL DECISION MAKING: Evolving/moderate complexity  EVALUATION COMPLEXITY: Moderate   GOALS: Goals reviewed with patient? Yes  SHORT TERM GOALS: Target date: 09/11/2023  Pt will be independent with HEP to improve strength and decrease knee pain to improve pain-free function at home and work. Baseline: 08/21/23: Baseline HEP reviewed/MedBridge handout provided.      09/27/23: Pt reports completing her HEP each day, sometimes completing extra reps and sometimes doing slightly less.  Goal status: ACHIEVED  Pt will improve L knee AROM to 0-100 in first 3-4 weeks of PT as needed for improved ROM needed for transferring, self-care ADLs Baseline: 08/21/23: L knee AROM -8-57.    09/27/23: L knee AROM -2-94.    10/23/23: L knee AROM -2-115.    11/06/23: L knee AROM -2-120 Goal status: MOSTLY MET    LONG TERM GOALS: Target date: 10/19/2023  Pt will increase FOTO to at least 58 to demonstrate significant improvement in function at home and work related to knee pain  Baseline: 08/21/23: 37.       09/27/23: 47/58.    10/23/23: 53/58.    11/06/23: 52/58.  Goal status: IN PROGRESS  2.  Pt will decrease worst knee pain by at least 3 points on the NPRS in order to demonstrate clinically significant reduction in knee pain. Baseline: 08/21/23: 8/10 pain at worst      09/27/23: 3-4/10 at worst with ADLs/activity.  Goal status: ACHIEVED   3.  Pt will have L knee AROM 0-120 indicative of full ROM as needed for completion of LE  management, self-care ADLs, and transfers from various-height surfaces.    Baseline: 08/21/23: -8-57.  09/27/23: AROM -2-94, PROM -1-107    10/23/23: L  knee AROM -2-115, PROM -1-119.  11/06/23: L knee AROM -2-120, PROM -1-122.    Goal status: IN PROGRESS/MOSTLY MET   4.  Pt will increase strength of tested lower extremity musculature to at least 4+/5 or greater MMT grade in order to demonstrate improvement in strength and function  Baseline: 08/21/23: LLE strength 3- to 4/5 with exception of modified hip ABD/ADD MMT in sitting.   09/27/23: 4+ for all muscles tested with exception of hip flexion.    10/23/23: 4+/5 or greater for LE  Goal status: ACHIEVED   5.  Pt will ambulate without AD at community-level distance (660 ft or greater) without LOB, symmetrical weightbearing, and no major gait deviations  Baseline: 08/21/23: Limited capacity for community-level gait, use of FWW primarily.     09/27/23: Pt able to ambulate 575 feet, developed discomfort in R hip and L foot from slip-on footwear that is not supportive and higher volume of stepping, dec velocity of LLE swing during swing phase.      10/23/23: Pt able to ambulate > 660 ft with no significant gait deviations.  Goal status: ACHIEVED   PLAN: PT FREQUENCY: 1-2x/week  PT DURATION: 3-4 weeks  PLANNED INTERVENTIONS: Therapeutic exercises, Therapeutic activity, Neuromuscular re-education, Balance training, Gait training, Patient/Family education, Self Care, Joint mobilization, Joint manipulation, Vestibular training, Canalith repositioning, Orthotic/Fit training, DME instructions, Dry Needling, Electrical stimulation, Spinal manipulation, Spinal mobilization, Cryotherapy, Moist heat, Taping, Traction, Ultrasound, Ionotophoresis 4mg /ml Dexamethasone, Manual therapy, and Re-evaluation.  PLAN FOR NEXT SESSION: Continue with advanced strengthening, CKC drills e.g. step-up, step-down, obstacle negotiation, compliant surfaces. Work into functional lifting patterns e.g. goblet squatting/box lifts. Strategies for pain control or PF/TF joint mobility prn.   Consuela Mimes, PT, DPT #[id number]  Gertie Exon, PT 11/09/2023, 10:24 AM

## 2023-11-13 ENCOUNTER — Ambulatory Visit: Payer: Medicare HMO | Admitting: Physical Therapy

## 2023-11-17 ENCOUNTER — Encounter: Payer: Self-pay | Admitting: Physical Therapy

## 2023-11-20 ENCOUNTER — Encounter: Payer: Medicare HMO | Admitting: Physical Therapy

## 2023-11-24 ENCOUNTER — Encounter: Payer: Self-pay | Admitting: Physical Therapy

## 2023-11-27 ENCOUNTER — Encounter: Payer: Medicare HMO | Admitting: Physical Therapy

## 2023-11-29 ENCOUNTER — Encounter: Payer: Medicare HMO | Admitting: Physical Therapy

## 2023-12-04 ENCOUNTER — Encounter: Payer: Medicare HMO | Admitting: Physical Therapy

## 2023-12-06 ENCOUNTER — Encounter: Payer: Medicare HMO | Admitting: Physical Therapy

## 2024-05-17 ENCOUNTER — Other Ambulatory Visit: Payer: Self-pay | Admitting: Family Medicine

## 2024-05-17 DIAGNOSIS — R102 Pelvic and perineal pain: Secondary | ICD-10-CM

## 2024-05-22 ENCOUNTER — Ambulatory Visit
Admission: RE | Admit: 2024-05-22 | Discharge: 2024-05-22 | Disposition: A | Discharge status: Home / Self Care | Source: Ambulatory Visit | Attending: Family Medicine | Admitting: Family Medicine

## 2024-05-22 DIAGNOSIS — R102 Pelvic and perineal pain: Secondary | ICD-10-CM | POA: Insufficient documentation

## 2024-05-22 MED ORDER — IOHEXOL 300 MG/ML  SOLN
100.0000 mL | Freq: Once | INTRAMUSCULAR | Status: AC | PRN
  Administered 2024-05-22: 100 mL via INTRAVENOUS
